# Patient Record
Sex: Female | Born: 1945 | Race: Black or African American | Hispanic: No | Smoking: Never smoker
Health system: Southern US, Community
[De-identification: ages and names within clinical notes are randomized; demographics above are authoritative.]

## PROBLEM LIST (undated history)

## (undated) DIAGNOSIS — E785 Hyperlipidemia, unspecified: Secondary | ICD-10-CM

## (undated) DIAGNOSIS — H409 Unspecified glaucoma: Secondary | ICD-10-CM

## (undated) DIAGNOSIS — I10 Essential (primary) hypertension: Secondary | ICD-10-CM

## (undated) DIAGNOSIS — E119 Type 2 diabetes mellitus without complications: Secondary | ICD-10-CM

## (undated) DIAGNOSIS — Z9989 Dependence on other enabling machines and devices: Secondary | ICD-10-CM

## (undated) DIAGNOSIS — I82409 Acute embolism and thrombosis of unspecified deep veins of unspecified lower extremity: Secondary | ICD-10-CM

## (undated) DIAGNOSIS — M109 Gout, unspecified: Secondary | ICD-10-CM

## (undated) HISTORY — PX: BREAST BIOPSY: SHX20

## (undated) HISTORY — PX: ABDOMINAL HYSTERECTOMY: SHX81

## (undated) HISTORY — DX: Hyperlipidemia, unspecified: E78.5

## (undated) HISTORY — PX: COLONOSCOPY: SHX174

## (undated) HISTORY — PX: EYE SURGERY: SHX253

## (undated) HISTORY — DX: Type 2 diabetes mellitus without complications: E11.9

---

## 2011-08-24 ENCOUNTER — Other Ambulatory Visit: Payer: Self-pay | Admitting: Internal Medicine

## 2011-08-24 DIAGNOSIS — Z1231 Encounter for screening mammogram for malignant neoplasm of breast: Secondary | ICD-10-CM

## 2011-09-21 ENCOUNTER — Ambulatory Visit
Admission: RE | Admit: 2011-09-21 | Discharge: 2011-09-21 | Disposition: A | Discharge status: Home / Self Care | Payer: Medicare Other | Source: Ambulatory Visit | Attending: Internal Medicine | Admitting: Internal Medicine

## 2011-09-21 DIAGNOSIS — Z1231 Encounter for screening mammogram for malignant neoplasm of breast: Secondary | ICD-10-CM

## 2011-11-23 ENCOUNTER — Other Ambulatory Visit: Payer: Self-pay | Admitting: Internal Medicine

## 2011-11-23 DIAGNOSIS — M810 Age-related osteoporosis without current pathological fracture: Secondary | ICD-10-CM

## 2011-11-28 ENCOUNTER — Ambulatory Visit
Admission: RE | Admit: 2011-11-28 | Discharge: 2011-11-28 | Disposition: A | Discharge status: Home / Self Care | Payer: Medicare Other | Source: Ambulatory Visit | Attending: Internal Medicine | Admitting: Internal Medicine

## 2011-11-28 DIAGNOSIS — M810 Age-related osteoporosis without current pathological fracture: Secondary | ICD-10-CM

## 2011-11-28 DIAGNOSIS — Z780 Asymptomatic menopausal state: Secondary | ICD-10-CM

## 2012-07-09 ENCOUNTER — Emergency Department (HOSPITAL_COMMUNITY)
Admission: EM | Admit: 2012-07-09 | Discharge: 2012-07-10 | Disposition: A | Discharge status: Home / Self Care | Payer: Medicare Other | Attending: Emergency Medicine | Admitting: Emergency Medicine

## 2012-07-09 ENCOUNTER — Encounter (HOSPITAL_COMMUNITY): Payer: Self-pay | Admitting: *Deleted

## 2012-07-09 DIAGNOSIS — Z79899 Other long term (current) drug therapy: Secondary | ICD-10-CM | POA: Insufficient documentation

## 2012-07-09 DIAGNOSIS — I10 Essential (primary) hypertension: Secondary | ICD-10-CM | POA: Insufficient documentation

## 2012-07-09 DIAGNOSIS — E119 Type 2 diabetes mellitus without complications: Secondary | ICD-10-CM | POA: Insufficient documentation

## 2012-07-09 HISTORY — DX: Essential (primary) hypertension: I10

## 2012-07-09 LAB — CBC
HCT: 38.4 % (ref 36.0–46.0)
Hemoglobin: 13.3 g/dL (ref 12.0–15.0)
MCV: 85.7 fL (ref 78.0–100.0)
RBC: 4.48 MIL/uL (ref 3.87–5.11)
WBC: 5.5 10*3/uL (ref 4.0–10.5)

## 2012-07-09 LAB — URINALYSIS, ROUTINE W REFLEX MICROSCOPIC
Glucose, UA: NEGATIVE mg/dL
Ketones, ur: NEGATIVE mg/dL
Leukocytes, UA: NEGATIVE
Nitrite: NEGATIVE
Specific Gravity, Urine: 1.022 (ref 1.005–1.030)
pH: 5.5 (ref 5.0–8.0)

## 2012-07-09 LAB — POCT I-STAT, CHEM 8
BUN: 14 mg/dL (ref 6–23)
Calcium, Ion: 1.23 mmol/L (ref 1.13–1.30)
Chloride: 104 mEq/L (ref 96–112)
Glucose, Bld: 101 mg/dL — ABNORMAL HIGH (ref 70–99)
TCO2: 32 mmol/L (ref 0–100)

## 2012-07-09 LAB — BASIC METABOLIC PANEL
CO2: 25 mEq/L (ref 19–32)
Chloride: 105 mEq/L (ref 96–112)
Creatinine, Ser: 0.81 mg/dL (ref 0.50–1.10)
GFR calc Af Amer: 85 mL/min — ABNORMAL LOW (ref >90)
Potassium: 3.6 mEq/L (ref 3.5–5.1)
Sodium: 142 mEq/L (ref 135–145)

## 2012-07-09 MED ORDER — ALBUTEROL SULFATE (5 MG/ML) 0.5% IN NEBU
INHALATION_SOLUTION | RESPIRATORY_TRACT | Status: AC
  Filled 2012-07-09: qty 1

## 2012-07-09 MED ORDER — IPRATROPIUM BROMIDE 0.02 % IN SOLN
RESPIRATORY_TRACT | Status: AC
  Filled 2012-07-09: qty 2.5

## 2012-07-09 NOTE — ED Notes (Signed)
PA at bedside.

## 2012-07-09 NOTE — ED Notes (Signed)
Lab contacted about wait time for CBC. Per lab sample was clotted. Phlebotomy at bedside redrawing.

## 2012-07-09 NOTE — ED Notes (Signed)
Pt reports increase in BP since 11th. Saw PCP on the 11th, increased BP Labetalol from 20 mg to 50 mg. Since then pt reports continued high BP. Pt reports this AM 191/98, which is "normal" for her since the 11th. Pt here today to "know what is going on." Denies HA, pain, complaint. AO x4.

## 2012-07-09 NOTE — ED Notes (Signed)
Pt was seen at her MD office earlier this month and states had bp medication was changed and states that bp is flucuating higher than normal.  Pt states yesterday bp was in the 190s systolic.  PT reports headache

## 2012-07-09 NOTE — ED Provider Notes (Signed)
History     CSN: [id number]  Arrival date & time 07/09/12  1810   First MD Initiated Contact with Patient 07/09/12 2152      Chief Complaint  Patient presents with  . Hypertension    (Consider location/radiation/quality/duration/timing/severity/associated sxs/prior treatment) The history is provided by the patient. No language interpreter was used.   67 year old female presents emergency department with chief complaint of high blood pressure.  The patient is a poor historian.The patient's primary care physician is Dr.Avubere.  She also has a history of hypertension and diabetes.  Patient states that she has been taking losartan and metoprolol.  But that her blood pressures have been running high.  She states she has been taking her medications as directed.  Patient became concerned today when her blood pressure was elevated above 190.  She has a book that she has been reading about blood pressures which state that she should seek medical evaluation of her blood pressure is above 180/120.  Patient is concerned because her husband died of kidney failure.  She has multiple family  members who have had kidney failure secondary to longstanding HTN. Patient denies any recent increase in her salt intake.  She denies any use of OTC medications containing ephedrine or diet pills.  Patient denies any headaches, altered mental status, seizures, nausea or vomiting.  She denies any chest pain.  Patient denies any focal neurologic deficits such as hemiparesis, amaurosis fugax, difficulty with speech or gait.  Past Medical History  Diagnosis Date  . Hypertension     Past Surgical History  Procedure Laterality Date  . Abdominal hysterectomy      No family history on file.  History  Substance Use Topics  . Smoking status: Never Smoker   . Smokeless tobacco: Not on file  . Alcohol Use: No    OB History   Grav Para Term Preterm Abortions TAB SAB Ect Mult Living                  Review of  Systems Ten systems reviewed and are negative for acute change, except as noted in the HPI.   Allergies  Review of patient's allergies indicates no known allergies.  Home Medications   Current Outpatient Rx  Name  Route  Sig  Dispense  Refill  . atorvastatin (LIPITOR) 40 MG tablet   Oral   Take 40 mg by mouth daily.         Marland Kitchen losartan (COZAAR) 50 MG tablet   Oral   Take 50 mg by mouth daily.         . metFORMIN (GLUCOPHAGE) 850 MG tablet   Oral   Take 850 mg by mouth daily.         . metoprolol succinate (TOPROL-XL) 50 MG 24 hr tablet   Oral   Take 50 mg by mouth daily. Take with or immediately following a meal.           BP 161/83  Pulse 53  Temp(Src) 97.3 F (36.3 C) (Oral)  Resp 17  SpO2 98%  Physical Exam Physical Exam  Nursing note and vitals reviewed. Constitutional: She is oriented to person, place, and time. She appears well-developed and well-nourished. No distress.  HENT:  Head: Normocephalic and atraumatic.  Eyes: Conjunctivae normal and EOM are normal. Pupils are equal, round, and reactive to light. No scleral icterus.  Neck: Normal range of motion.  Cardiovascular: Normal rate, regular rhythm and normal heart sounds.  Exam reveals no gallop  and no friction rub.   No murmur heard. Pulmonary/Chest: Effort normal and breath sounds normal. No respiratory distress.  Abdominal: Soft. Bowel sounds are normal. She exhibits no distension and no mass. There is no tenderness. There is no guarding.  Neurological: She is alert and oriented to person, place, and time.  Skin: Skin is warm and dry. She is not diaphoretic.    ED Course  Procedures (including critical care time)  Labs Reviewed  POCT I-STAT, CHEM 8 - Abnormal; Notable for the following:    Glucose, Bld 101 (*)    All other components within normal limits  CBC  URINALYSIS, ROUTINE W REFLEX MICROSCOPIC  BASIC METABOLIC PANEL   No results found.   No diagnosis found.    MDM    Filed Vitals:   07/09/12 2155 07/09/12 2200 07/09/12 2245 07/10/12 0000  BP: 177/83 161/83 164/70 155/85  Pulse: 51 53 52 51  Temp:      TempSrc:      Resp: 18 17 17    SpO2: 100% 98% 99% 100%  patient with asymptomatic HTN.  I have discussed with the patient what qualifies as an emergent situation and what we treat here in the ED>  I let the patient know that we will evaluate basic labs, and if there is no sign of end organ damage. She will need to see her PCP for management.  Patient is without si/sx of stroke.      Patients labs are without acute abnormality. I will dc the patient with instructions to see her PCP for med adjustment. i have let the patient know that her kidney fx  Is fine.  At this time there does not appear to be any evidence of an acute emergency medical condition and the patient appears stable for discharge with appropriate outpatient follow up.Diagnosis was discussed with patient who verbalizes understanding and is agreeable to discharge.       Arthor Captain, PA-C 07/12/12 2110

## 2012-07-13 NOTE — ED Provider Notes (Signed)
Medical screening examination/treatment/procedure(s) were performed by non-physician practitioner and as supervising physician I was immediately available for consultation/collaboration.   Suzi Roots, MD 07/13/12 1031

## 2012-10-31 ENCOUNTER — Other Ambulatory Visit: Payer: Self-pay

## 2012-10-31 DIAGNOSIS — Z1231 Encounter for screening mammogram for malignant neoplasm of breast: Secondary | ICD-10-CM

## 2012-11-08 ENCOUNTER — Ambulatory Visit: Payer: Medicare Other

## 2012-12-25 ENCOUNTER — Ambulatory Visit: Payer: Medicare Other

## 2013-01-03 ENCOUNTER — Ambulatory Visit
Admission: RE | Admit: 2013-01-03 | Discharge: 2013-01-03 | Disposition: A | Discharge status: Home / Self Care | Payer: Medicare Other | Source: Ambulatory Visit

## 2013-01-03 DIAGNOSIS — Z1231 Encounter for screening mammogram for malignant neoplasm of breast: Secondary | ICD-10-CM

## 2013-01-07 ENCOUNTER — Other Ambulatory Visit: Payer: Self-pay | Admitting: Internal Medicine

## 2013-01-07 DIAGNOSIS — R928 Other abnormal and inconclusive findings on diagnostic imaging of breast: Secondary | ICD-10-CM

## 2013-01-21 ENCOUNTER — Ambulatory Visit
Admission: RE | Admit: 2013-01-21 | Discharge: 2013-01-21 | Disposition: A | Discharge status: Home / Self Care | Payer: PRIVATE HEALTH INSURANCE | Source: Ambulatory Visit | Attending: Internal Medicine | Admitting: Internal Medicine

## 2013-01-21 DIAGNOSIS — R928 Other abnormal and inconclusive findings on diagnostic imaging of breast: Secondary | ICD-10-CM

## 2013-01-28 ENCOUNTER — Other Ambulatory Visit: Payer: Medicare Other

## 2013-01-28 ENCOUNTER — Inpatient Hospital Stay: Admission: RE | Admit: 2013-01-28 | Payer: Medicare Other | Source: Ambulatory Visit

## 2013-03-19 ENCOUNTER — Ambulatory Visit (INDEPENDENT_AMBULATORY_CARE_PROVIDER_SITE_OTHER): Payer: Medicare Other

## 2013-03-19 VITALS — BP 130/72 | HR 77 | Resp 12 | Ht 65.0 in | Wt 207.0 lb

## 2013-03-19 DIAGNOSIS — E1149 Type 2 diabetes mellitus with other diabetic neurological complication: Secondary | ICD-10-CM

## 2013-03-19 DIAGNOSIS — L608 Other nail disorders: Secondary | ICD-10-CM

## 2013-03-19 DIAGNOSIS — B351 Tinea unguium: Secondary | ICD-10-CM

## 2013-03-19 DIAGNOSIS — E1140 Type 2 diabetes mellitus with diabetic neuropathy, unspecified: Secondary | ICD-10-CM | POA: Insufficient documentation

## 2013-03-19 DIAGNOSIS — E1142 Type 2 diabetes mellitus with diabetic polyneuropathy: Secondary | ICD-10-CM

## 2013-03-19 NOTE — Patient Instructions (Signed)
Onychomycosis/Fungal Toenails  WHAT IS IT? An infection that lies within the keratin of your nail plate that is caused by a fungus.  WHY ME? Fungal infections affect all ages, sexes, races, and creeds.  There may be many factors that predispose you to a fungal infection such as age, coexisting medical conditions such as diabetes, or an autoimmune disease; stress, medications, fatigue, genetics, etc.  Bottom line: fungus thrives in a warm, moist environment and your shoes offer such a location.  IS IT CONTAGIOUS? Theoretically, yes.  You do not want to share shoes, nail clippers or files with someone who has fungal toenails.  Walking around barefoot in the same room or sleeping in the same bed is unlikely to transfer the organism.  It is important to realize, however, that fungus can spread easily from one nail to the next on the same foot.  HOW DO WE TREAT THIS?  There are several ways to treat this condition.  Treatment may depend on many factors such as age, medications, pregnancy, liver and kidney conditions, etc.  It is best to ask your doctor which options are available to you.  1. No treatment.   Unlike many other medical concerns, you can live with this condition.  However for many people this can be a painful condition and may lead to ingrown toenails or a bacterial infection.  It is recommended that you keep the nails cut short to help reduce the amount of fungal nail. 2. Topical treatment.  These range from herbal remedies to prescription strength nail lacquers.  About 40-50% effective, topicals require twice daily application for approximately 9 to 12 months or until an entirely new nail has grown out.  The most effective topicals are medical grade medications available through physicians offices. 3. Oral antifungal medications.  With an 80-90% cure rate, the most common oral medication requires 3 to 4 months of therapy and stays in your system for a year as the new nail grows out.  Oral  antifungal medications do require blood work to make sure it is a safe drug for you.  A liver function panel will be performed prior to starting the medication and after the first month of treatment.  It is important to have the blood work performed to avoid any harmful side effects.  In general, this medication safe but blood work is required. 4. Laser Therapy.  This treatment is performed by applying a specialized laser to the affected nail plate.  This therapy is noninvasive, fast, and non-painful.  It is not covered by insurance and is therefore, out of pocket.  The results have been very good with a 80-95% cure rate.  The [facility] is the only practice in the area to offer this therapy. 5. Permanent Nail Avulsion.  Removing the entire nail so that a new nail will not grow back. 6. Apply topical Fungi-Nail to affected nails twice daily for 12 month. Minimum

## 2013-03-19 NOTE — Progress Notes (Signed)
  Subjective:    Patient ID: Sharon Michael, female    DOB: 07/07/1945, 67 y.o.   MRN: [id number] "Trim the toenails and the fungus came back."  HPI no changes medication her health history. Nails particular second toes bilateral show thickening darkening discoloration and incurvation. Patient been applying topical Fungi-Nail since April of this year.    Review of Systems different at this visit     Objective:   Physical Exam  Pedal pulses dorsalis pedis +2/4 PT pulse thready to absent bilateral zero over four bilateral. Epicritic and proprioceptive sensations diminished on Semmes Weinstein testing to forefoot digits bilateral. No varicosities no excessive edema noted. Orthopedic exam reveals rigid digital contractures hammertoe/claw toe contractures of toes. 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 Patient denies to continue topical antifungal therapy utilizing Fungi-Nail twice daily to the affected nails. Nails debrided 1 through 5 bilateral at this time the presence of diabetes complicating  factors. Followup in 3 months for continued palliative care  Alvan Dame DP

## 2013-06-18 ENCOUNTER — Ambulatory Visit (INDEPENDENT_AMBULATORY_CARE_PROVIDER_SITE_OTHER): Payer: Medicare HMO

## 2013-06-18 VITALS — BP 103/61 | HR 49 | Resp 16

## 2013-06-18 DIAGNOSIS — E1140 Type 2 diabetes mellitus with diabetic neuropathy, unspecified: Secondary | ICD-10-CM

## 2013-06-18 DIAGNOSIS — E1142 Type 2 diabetes mellitus with diabetic polyneuropathy: Secondary | ICD-10-CM

## 2013-06-18 DIAGNOSIS — M79609 Pain in unspecified limb: Secondary | ICD-10-CM

## 2013-06-18 DIAGNOSIS — B351 Tinea unguium: Secondary | ICD-10-CM

## 2013-06-18 DIAGNOSIS — Q828 Other specified congenital malformations of skin: Secondary | ICD-10-CM

## 2013-06-18 DIAGNOSIS — E1149 Type 2 diabetes mellitus with other diabetic neurological complication: Secondary | ICD-10-CM

## 2013-06-18 NOTE — Patient Instructions (Signed)
Diabetes and Foot Care Diabetes may cause you to have problems because of poor blood supply (circulation) to your feet and legs. This may cause the skin on your feet to become thinner, break easier, and heal more slowly. Your skin may become dry, and the skin may peel and crack. You may also have nerve damage in your legs and feet causing decreased feeling in them. You may not notice minor injuries to your feet that could lead to infections or more serious problems. Taking care of your feet is one of the most important things you can do for yourself.  HOME CARE INSTRUCTIONS  Wear shoes at all times, even in the house. Do not go barefoot. Bare feet are easily injured.  Check your feet daily for blisters, cuts, and redness. If you cannot see the bottom of your feet, use a mirror or ask someone for help.  Wash your feet with warm water (do not use hot water) and mild soap. Then pat your feet and the areas between your toes until they are completely dry. Do not soak your feet as this can dry your skin.  Apply a moisturizing lotion or petroleum jelly (that does not contain alcohol and is unscented) to the skin on your feet and to dry, brittle toenails. Do not apply lotion between your toes.  Trim your toenails straight across. Do not dig under them or around the cuticle. File the edges of your nails with an emery board or nail file.  Do not cut corns or calluses or try to remove them with medicine.  Wear clean socks or stockings every day. Make sure they are not too tight. Do not wear knee-high stockings since they may decrease blood flow to your legs.  Wear shoes that fit properly and have enough cushioning. To break in new shoes, wear them for just a few hours a day. This prevents you from injuring your feet. Always look in your shoes before you put them on to be sure there are no objects inside.  Do not cross your legs. This may decrease the blood flow to your feet.  If you find a minor scrape,  cut, or break in the skin on your feet, keep it and the skin around it clean and dry. These areas may be cleansed with mild soap and water. Do not cleanse the area with peroxide, alcohol, or iodine.  When you remove an adhesive bandage, be sure not to damage the skin around it.  If you have a wound, look at it several times a day to make sure it is healing.  Do not use heating pads or hot water bottles. They may burn your skin. If you have lost feeling in your feet or legs, you may not know it is happening until it is too late.  Make sure your health care provider performs a complete foot exam at least annually or more often if you have foot problems. Report any cuts, sores, or bruises to your health care provider immediately. SEEK MEDICAL CARE IF:   You have an injury that is not healing.  You have cuts or breaks in the skin.  You have an ingrown nail.  You notice redness on your legs or feet.  You feel burning or tingling in your legs or feet.  You have pain or cramps in your legs and feet.  Your legs or feet are numb.  Your feet always feel cold. SEEK IMMEDIATE MEDICAL CARE IF:   There is increasing redness,   swelling, or pain in or around a wound.  There is a red line that goes up your leg.  Pus is coming from a wound.  You develop a fever or as directed by your health care provider.  You notice a bad smell coming from an ulcer or wound. Document Released: 04/29/2000 Document Revised: 01/02/2013 Document Reviewed: 10/09/2012 [employer] Patient Information 2014 [employer].  

## 2013-06-18 NOTE — Progress Notes (Signed)
   Subjective:    Patient ID: Sharon Michael, female    DOB: 03/09/1946, 68 y.o.   MRN: [id number]  HPI I am here to get my toenails cut and I do have some fungus and I have been doing the fungi nails    Review of Systems deferred at this visit     Objective:   Physical Exam Vascular status as follows dorsalis pedis pulse two over four bilateral PT thready to absent bilateral posse plus one over 4 on the left epicritic and proprioceptive sensations intact and symmetric although diminished bilateral on Semmes Weinstein testing to the digits and plantar forefoot. There is normal plantar response DTRs not elicited. Dermatologically skin color pigment normal no open wounds ulcerations no excessive edema noted orthopedic exam reveals rigid digital contractures 2 through 5 there is pinch callus the hallux bilateral as well as keratoses sub-fifth MTP area bilateral secondary digital contracture and plantar flexed metatarsals.       Assessment & Plan:  Assessment this time diabetes with history peripheral neuropathy and complications. Patient's thick brittle crumbly dystrophic nails 1 through 5 bilateral on debridement presence of diabetes as well as onychomycosis great advised to continue with topical Fungi-Nail over-the-counter on a daily application twice daily application as instructed. Also at this time debridement multiple keratoses sub-fifth bilateral and pinch callus first right Neosporin and Band-Aid applied to the fifth bilateral following debridement return for future palliative care and as-needed basis maintain accommodative diabetic shoes as needed reappointed 3 months next  Alvan Dameichard Jammy Stlouis DPM

## 2013-09-13 ENCOUNTER — Ambulatory Visit (INDEPENDENT_AMBULATORY_CARE_PROVIDER_SITE_OTHER): Payer: Medicare HMO

## 2013-09-13 VITALS — BP 136/79 | HR 68 | Resp 16

## 2013-09-13 DIAGNOSIS — E1140 Type 2 diabetes mellitus with diabetic neuropathy, unspecified: Secondary | ICD-10-CM

## 2013-09-13 DIAGNOSIS — Q828 Other specified congenital malformations of skin: Secondary | ICD-10-CM

## 2013-09-13 DIAGNOSIS — M79609 Pain in unspecified limb: Secondary | ICD-10-CM

## 2013-09-13 DIAGNOSIS — B351 Tinea unguium: Secondary | ICD-10-CM

## 2013-09-13 DIAGNOSIS — E1149 Type 2 diabetes mellitus with other diabetic neurological complication: Secondary | ICD-10-CM

## 2013-09-13 DIAGNOSIS — E1142 Type 2 diabetes mellitus with diabetic polyneuropathy: Secondary | ICD-10-CM

## 2013-09-13 NOTE — Patient Instructions (Signed)
Diabetes and Foot Care Diabetes may cause you to have problems because of poor blood supply (circulation) to your feet and legs. This may cause the skin on your feet to become thinner, break easier, and heal more slowly. Your skin may become dry, and the skin may peel and crack. You may also have nerve damage in your legs and feet causing decreased feeling in them. You may not notice minor injuries to your feet that could lead to infections or more serious problems. Taking care of your feet is one of the most important things you can do for yourself.  HOME CARE INSTRUCTIONS  Wear shoes at all times, even in the house. Do not go barefoot. Bare feet are easily injured.  Check your feet daily for blisters, cuts, and redness. If you cannot see the bottom of your feet, use a mirror or ask someone for help.  Wash your feet with warm water (do not use hot water) and mild soap. Then pat your feet and the areas between your toes until they are completely dry. Do not soak your feet as this can dry your skin.  Apply a moisturizing lotion or petroleum jelly (that does not contain alcohol and is unscented) to the skin on your feet and to dry, brittle toenails. Do not apply lotion between your toes.  Trim your toenails straight across. Do not dig under them or around the cuticle. File the edges of your nails with an emery board or nail file.  Do not cut corns or calluses or try to remove them with medicine.  Wear clean socks or stockings every day. Make sure they are not too tight. Do not wear knee-high stockings since they may decrease blood flow to your legs.  Wear shoes that fit properly and have enough cushioning. To break in new shoes, wear them for just a few hours a day. This prevents you from injuring your feet. Always look in your shoes before you put them on to be sure there are no objects inside.  Do not cross your legs. This may decrease the blood flow to your feet.  If you find a minor scrape,  cut, or break in the skin on your feet, keep it and the skin around it clean and dry. These areas may be cleansed with mild soap and water. Do not cleanse the area with peroxide, alcohol, or iodine.  When you remove an adhesive bandage, be sure not to damage the skin around it.  If you have a wound, look at it several times a day to make sure it is healing.  Do not use heating pads or hot water bottles. They may burn your skin. If you have lost feeling in your feet or legs, you may not know it is happening until it is too late.  Make sure your health care provider performs a complete foot exam at least annually or more often if you have foot problems. Report any cuts, sores, or bruises to your health care provider immediately. SEEK MEDICAL CARE IF:   You have an injury that is not healing.  You have cuts or breaks in the skin.  You have an ingrown nail.  You notice redness on your legs or feet.  You feel burning or tingling in your legs or feet.  You have pain or cramps in your legs and feet.  Your legs or feet are numb.  Your feet always feel cold. SEEK IMMEDIATE MEDICAL CARE IF:   There is increasing redness,   swelling, or pain in or around a wound.  There is a red line that goes up your leg.  Pus is coming from a wound.  You develop a fever or as directed by your health care provider.  You notice a bad smell coming from an ulcer or wound. Document Released: 04/29/2000 Document Revised: 01/02/2013 Document Reviewed: 10/09/2012 [employer] Patient Information 2014 [employer].  

## 2013-09-13 NOTE — Progress Notes (Signed)
   Subjective:    Patient ID: Sharon PurlAmmie Michael, female    DOB: 01/18/1946, 68 y.o.   MRN: [id number]  HPI Comments: "I need my toenails cut"       Review of Systems And any systemic changes or findings    Objective:   Physical Exam Neurovascular status is intact DP +2/4 bilateral PT thready one over 4 bilateral there is For refill timed 3-4 seconds all digits. Epicritic and proprioceptive sensations intact although diminished bilateral on Semmes Weinstein testing to the digits and plantar forefoot. Patient does have some hyperesthesia in her toes on debridement and on palpation. There is a single keratotic lesion sub-fifth MTP area left painful direct lateral compression consistent with porokeratosis nails thick brittle crumbly friable discolored and darkened consistent with onychomycosis second right being most painful and symptomatic attempted debridement and open wounds ulcerations no secondary infection is noted orthopedic exam reveals mild flexible digital contractures       Assessment & Plan:  Assessment this time his diabetes with history peripheral neuropathy plan at this time thick brittle crumbly friable criptotic nails painful symptomatic 1 through 5 bilateral debridement at this time still keratotic lesion sub-5 left is debrided entry with lumicain Neosporin and Band-Aid dressing. Maintain dressings return in 3 months for continued followup and palliative care in the future as needed maintain appropriate coming shoes at all times  Alvan Dameichard Kylyn Mcdade DPM

## 2013-12-06 ENCOUNTER — Other Ambulatory Visit: Payer: Self-pay | Admitting: Internal Medicine

## 2013-12-06 DIAGNOSIS — E2839 Other primary ovarian failure: Secondary | ICD-10-CM

## 2013-12-13 ENCOUNTER — Ambulatory Visit
Admission: RE | Admit: 2013-12-13 | Discharge: 2013-12-13 | Disposition: A | Discharge status: Home / Self Care | Payer: Commercial Managed Care - HMO | Source: Ambulatory Visit | Attending: Internal Medicine | Admitting: Internal Medicine

## 2013-12-13 DIAGNOSIS — E2839 Other primary ovarian failure: Secondary | ICD-10-CM

## 2013-12-20 ENCOUNTER — Ambulatory Visit (INDEPENDENT_AMBULATORY_CARE_PROVIDER_SITE_OTHER): Payer: Medicare HMO | Admitting: Podiatrist

## 2013-12-20 DIAGNOSIS — B351 Tinea unguium: Secondary | ICD-10-CM

## 2013-12-20 DIAGNOSIS — M79609 Pain in unspecified limb: Secondary | ICD-10-CM

## 2013-12-20 DIAGNOSIS — E1140 Type 2 diabetes mellitus with diabetic neuropathy, unspecified: Secondary | ICD-10-CM

## 2013-12-20 DIAGNOSIS — E1149 Type 2 diabetes mellitus with other diabetic neurological complication: Secondary | ICD-10-CM

## 2013-12-20 DIAGNOSIS — E1142 Type 2 diabetes mellitus with diabetic polyneuropathy: Secondary | ICD-10-CM

## 2013-12-20 DIAGNOSIS — M79673 Pain in unspecified foot: Secondary | ICD-10-CM

## 2013-12-20 NOTE — Progress Notes (Signed)
HPI: Patient presents today for follow up of diabetic foot and nail care. Past medical history, meds, and allergies reviewed. Patient states blood sugar is under good  control.   Objective:   Objective:  Patients chart is reviewed.  Vascular status reveals pedal pulses noted at  2 out of 4 dp and pt bilateral .  Neurological sensation is Normal to [facility] bilateral at 2/5 sites bilateral.  Dermatological exam reveals  presence of very mild pre ulcerative/ hyperkeratotic lesions submetatarsal 5 bilateral.   Toenails are elongated, incurvated, discolored, dystrophic with ingrown deformity present. Flexible digital contractures are also noted   Assessment: Diabetes with Neuropathy , Ingrown nail deformity, hyperkeratotic lesion x 2   Plan: Discussed treatment options and alternatives. Debrided nails without complication. Debrided hyperkeratotic lesions without complication.  Return appointment recommended at routine intervals of 3 months.   Marlowe AschoffKathryn Callyn Severtson, DPM

## 2013-12-20 NOTE — Patient Instructions (Signed)
Diabetes and Foot Care Diabetes may cause you to have problems because of poor blood supply (circulation) to your feet and legs. This may cause the skin on your feet to become thinner, break easier, and heal more slowly. Your skin may become dry, and the skin may peel and crack. You may also have nerve damage in your legs and feet causing decreased feeling in them. You may not notice minor injuries to your feet that could lead to infections or more serious problems. Taking care of your feet is one of the most important things you can do for yourself.  HOME CARE INSTRUCTIONS  Wear shoes at all times, even in the house. Do not go barefoot. Bare feet are easily injured.  Check your feet daily for blisters, cuts, and redness. If you cannot see the bottom of your feet, use a mirror or ask someone for help.  Wash your feet with warm water (do not use hot water) and mild soap. Then pat your feet and the areas between your toes until they are completely dry. Do not soak your feet as this can dry your skin.  Apply a moisturizing lotion or petroleum jelly (that does not contain alcohol and is unscented) to the skin on your feet and to dry, brittle toenails. Do not apply lotion between your toes.  Trim your toenails straight across. Do not dig under them or around the cuticle. File the edges of your nails with an emery board or nail file.  Do not cut corns or calluses or try to remove them with medicine.  Wear clean socks or stockings every day. Make sure they are not too tight. Do not wear knee-high stockings since they may decrease blood flow to your legs.  Wear shoes that fit properly and have enough cushioning. To break in new shoes, wear them for just a few hours a day. This prevents you from injuring your feet. Always look in your shoes before you put them on to be sure there are no objects inside.  Do not cross your legs. This may decrease the blood flow to your feet.  If you find a minor scrape,  cut, or break in the skin on your feet, keep it and the skin around it clean and dry. These areas may be cleansed with mild soap and water. Do not cleanse the area with peroxide, alcohol, or iodine.  When you remove an adhesive bandage, be sure not to damage the skin around it.  If you have a wound, look at it several times a day to make sure it is healing.  Do not use heating pads or hot water bottles. They may burn your skin. If you have lost feeling in your feet or legs, you may not know it is happening until it is too late.  Make sure your health care provider performs a complete foot exam at least annually or more often if you have foot problems. Report any cuts, sores, or bruises to your health care provider immediately. SEEK MEDICAL CARE IF:   You have an injury that is not healing.  You have cuts or breaks in the skin.  You have an ingrown nail.  You notice redness on your legs or feet.  You feel burning or tingling in your legs or feet.  You have pain or cramps in your legs and feet.  Your legs or feet are numb.  Your feet always feel cold. SEEK IMMEDIATE MEDICAL CARE IF:   There is increasing redness,   swelling, or pain in or around a wound.  There is a red line that goes up your leg.  Pus is coming from a wound.  You develop a fever or as directed by your health care provider.  You notice a bad smell coming from an ulcer or wound. Document Released: 04/29/2000 Document Revised: 01/02/2013 Document Reviewed: 10/09/2012 [employer] Patient Information 2015 [employer]. This information is not intended to replace advice given to you by your health care provider. Make sure you discuss any questions you have with your health care provider.  

## 2013-12-23 ENCOUNTER — Telehealth: Payer: Self-pay | Admitting: *Deleted

## 2013-12-23 ENCOUNTER — Encounter: Payer: Self-pay | Admitting: *Deleted

## 2013-12-23 NOTE — Telephone Encounter (Signed)
I called and informed her she can come by to pick up her letter.  She asked what time we closed.  I told her 5pm.  She said she would be by before 5pm.

## 2013-12-23 NOTE — Telephone Encounter (Signed)
I need a doctor's slip letting them know about my feet.  They want us to wear boots and I'm not able to wear boots.  I wear sneakers.  I need a note saying I have to wear sneakers.  She said as long as I get a slip it should be okay.

## 2013-12-23 NOTE — Telephone Encounter (Signed)
I called you once, just spoke to someone.  We had a meeting on Saturday.  They want us to wear boots.  I need a note saying I don't have to wear the boots.  I need a written thing from my doctor's office.

## 2014-01-29 ENCOUNTER — Other Ambulatory Visit: Payer: Self-pay

## 2014-01-29 DIAGNOSIS — Z1231 Encounter for screening mammogram for malignant neoplasm of breast: Secondary | ICD-10-CM

## 2014-02-12 ENCOUNTER — Ambulatory Visit
Admission: RE | Admit: 2014-02-12 | Discharge: 2014-02-12 | Disposition: A | Discharge status: Home / Self Care | Payer: Medicare HMO | Source: Ambulatory Visit

## 2014-02-12 DIAGNOSIS — Z1231 Encounter for screening mammogram for malignant neoplasm of breast: Secondary | ICD-10-CM

## 2014-03-25 ENCOUNTER — Ambulatory Visit: Payer: Medicare HMO

## 2014-04-01 ENCOUNTER — Ambulatory Visit (INDEPENDENT_AMBULATORY_CARE_PROVIDER_SITE_OTHER): Payer: Medicare HMO

## 2014-04-01 DIAGNOSIS — B351 Tinea unguium: Secondary | ICD-10-CM

## 2014-04-01 DIAGNOSIS — Q828 Other specified congenital malformations of skin: Secondary | ICD-10-CM

## 2014-04-01 DIAGNOSIS — M79673 Pain in unspecified foot: Secondary | ICD-10-CM

## 2014-04-01 DIAGNOSIS — E1140 Type 2 diabetes mellitus with diabetic neuropathy, unspecified: Secondary | ICD-10-CM

## 2014-04-01 NOTE — Progress Notes (Signed)
   Subjective:    Patient ID: Sharon Michael, female    DOB: 07/08/1945, 68 y.o.   MRN: [id number]  HPI  Pt presents for nail debridement  Review of Systemsno new findings or systemic changes noted     Objective:   Physical Exam Vascular status is intact DP +2 PT 1 over 4 bilateral thready Refill time 4 seconds all digits sensations diminished on Semmes Weinstein to the forefoot digits and arch. There is normal plantar response. There is keratotic lesion sub-5 bilateral left more so than right which on debridement were treated with lumicain Neosporin at this time. Patient also has multiple thick brittle dystrophic friable mycotic nails 1 through 5 bilateral painful tender both on palpation and with enclosed shoe wear. No open wounds no ulcers no secondary infection is noted. Rectus foot type with mild digital contractures are identified       Assessment & Plan:  Assessment this time is diabetes history peripheral neuropathy painful mycotic nails debrided 10 also multiple keratoses sub-5 bilateral debrided at this time Neosporin and Band-Aid dressings applied to both feet fifth MTP areas. Reappointed 3 months for palliative care  Alvan Dameichard Lakeitha Basques DPM

## 2014-07-01 ENCOUNTER — Ambulatory Visit (INDEPENDENT_AMBULATORY_CARE_PROVIDER_SITE_OTHER): Payer: Medicare HMO

## 2014-07-01 DIAGNOSIS — Q828 Other specified congenital malformations of skin: Secondary | ICD-10-CM

## 2014-07-01 DIAGNOSIS — E1140 Type 2 diabetes mellitus with diabetic neuropathy, unspecified: Secondary | ICD-10-CM

## 2014-07-01 DIAGNOSIS — B351 Tinea unguium: Secondary | ICD-10-CM

## 2014-07-01 DIAGNOSIS — M79673 Pain in unspecified foot: Secondary | ICD-10-CM

## 2014-07-01 NOTE — Progress Notes (Signed)
   Subjective:    Patient ID: Sharon Michael, female    DOB: 04/07/1946, 69 y.o.   MRN: [id number]  HPI  Pt presents for nail debridement  Review of Systems no new findings or systemic changes noted     Objective:   Physical Exam Neurovascular status unchanged DP +2 PT 1 over 4 bilateral there is keratoses sub-fifth MTP area bilateral there is thick brittle crumbly dystrophic friable nails 1 through 5 bilateral painful tender both on palpation and debridement at this time. No open wounds no ulcers no secondary infections mild digital contractures are noted       Assessment & Plan:  Assessment this time is diabetes history peripheral neuropathy painful mycotic nails debrided 1 through 5 bilateral at this time also debridement keratoses sub-5 bilateral sub-fifth right history with lumicain and Neosporin and a Band-Aid follow-up in 3 months for continued palliative care  Alvan Dameichard Syed Zukas DPM

## 2014-10-07 ENCOUNTER — Ambulatory Visit: Payer: Medicare HMO

## 2014-10-08 ENCOUNTER — Encounter: Payer: Self-pay | Admitting: Podiatry

## 2014-10-08 ENCOUNTER — Ambulatory Visit (INDEPENDENT_AMBULATORY_CARE_PROVIDER_SITE_OTHER): Payer: Medicare HMO

## 2014-10-08 ENCOUNTER — Ambulatory Visit (INDEPENDENT_AMBULATORY_CARE_PROVIDER_SITE_OTHER): Payer: Medicare HMO | Admitting: Podiatry

## 2014-10-08 DIAGNOSIS — M79674 Pain in right toe(s): Secondary | ICD-10-CM

## 2014-10-08 DIAGNOSIS — B351 Tinea unguium: Secondary | ICD-10-CM | POA: Diagnosis not present

## 2014-10-08 DIAGNOSIS — Q828 Other specified congenital malformations of skin: Secondary | ICD-10-CM | POA: Diagnosis not present

## 2014-10-08 DIAGNOSIS — M79673 Pain in unspecified foot: Secondary | ICD-10-CM | POA: Diagnosis not present

## 2014-10-08 DIAGNOSIS — E1140 Type 2 diabetes mellitus with diabetic neuropathy, unspecified: Secondary | ICD-10-CM

## 2014-10-08 DIAGNOSIS — M779 Enthesopathy, unspecified: Secondary | ICD-10-CM

## 2014-10-08 MED ORDER — TRIAMCINOLONE ACETONIDE 10 MG/ML IJ SUSP
10.0000 mg | Freq: Once | INTRAMUSCULAR | Status: AC
  Administered 2014-10-08: 10 mg

## 2014-10-10 NOTE — Progress Notes (Signed)
Subjective:     Patient ID: Sharon Michael, female   DOB: 04/16/1946, 69 y.o.   MRN: [id number]  HPI patient states I'm getting a lot of pain around my big toe joint right I have calluses and corns that form on both feet that are painful and my nailbeds become sore on both feet and I've had long-term diabetes   Review of Systems  All other systems reviewed and are negative.      Objective:   Physical Exam  Constitutional: She is oriented to person, place, and time.  Cardiovascular: Intact distal pulses.   Musculoskeletal: Normal range of motion.  Neurological: She is oriented to person, place, and time.  Skin: Skin is warm and dry.  Nursing note and vitals reviewed.  neurovascular status found to be mildly diminished with diminishment of sharp Dole vibratory and diminished PT and DP pulses noted. Patient has keratotic lesion sub-fifth metatarsal of both feet and has inflammation pain first MPJ right and is noted also to have elongated thickened nailbeds 1-5 both feet that are painful bilateral     Assessment:     Lesion secondary to pressure along with mycotic painful nail infections bilateral and inflammation and pain in the first MPJ right with mild reduction of motion of the joint and a long-term at risk diabetic    Plan:     H&P and all conditions reviewed with patient. Injected around the first MPJ right 3 mg Kenalog 5 mg Xylocaine and debrided lesions on both feet and debrided nailbeds 1-5 both feet with no iatrogenic bleeding noted

## 2014-10-14 ENCOUNTER — Telehealth: Payer: Self-pay | Admitting: *Deleted

## 2014-10-14 MED ORDER — TRAMADOL HCL 50 MG PO TABS: 50.0000 mg | ORAL_TABLET | Freq: Three times a day (TID) | ORAL | Status: DC | PRN

## 2014-10-14 NOTE — Telephone Encounter (Addendum)
Pt states she received and injection at the last visit, now has swelling and aching to her feet and would like a pain medication for the pain and swelling.  Dr Charlsie Merlesegal ordered Tramadol 50mg  #30 one every 8 hours and I informed pt that she would need to hand carry the rx to the pharmacy.  Pt states she will pick it up after work tomorrow morning.

## 2014-10-14 NOTE — Telephone Encounter (Signed)
Tramadol if she is not allergic

## 2015-01-09 ENCOUNTER — Other Ambulatory Visit: Payer: Self-pay

## 2015-01-09 DIAGNOSIS — Z1231 Encounter for screening mammogram for malignant neoplasm of breast: Secondary | ICD-10-CM

## 2015-01-15 ENCOUNTER — Ambulatory Visit: Payer: Medicare HMO | Admitting: Podiatry

## 2015-01-28 ENCOUNTER — Ambulatory Visit (INDEPENDENT_AMBULATORY_CARE_PROVIDER_SITE_OTHER): Payer: Medicare HMO | Admitting: Podiatry

## 2015-01-28 DIAGNOSIS — B351 Tinea unguium: Secondary | ICD-10-CM | POA: Diagnosis not present

## 2015-01-28 DIAGNOSIS — M79676 Pain in unspecified toe(s): Secondary | ICD-10-CM | POA: Diagnosis not present

## 2015-01-28 NOTE — Patient Instructions (Signed)
Diabetes and Foot Care Diabetes may cause you to have problems because of poor blood supply (circulation) to your feet and legs. This may cause the skin on your feet to become thinner, break easier, and heal more slowly. Your skin may become dry, and the skin may peel and crack. You may also have nerve damage in your legs and feet causing decreased feeling in them. You may not notice minor injuries to your feet that could lead to infections or more serious problems. Taking care of your feet is one of the most important things you can do for yourself.  HOME CARE INSTRUCTIONS  Wear shoes at all times, even in the house. Do not go barefoot. Bare feet are easily injured.  Check your feet daily for blisters, cuts, and redness. If you cannot see the bottom of your feet, use a mirror or ask someone for help.  Wash your feet with warm water (do not use hot water) and mild soap. Then pat your feet and the areas between your toes until they are completely dry. Do not soak your feet as this can dry your skin.  Apply a moisturizing lotion or petroleum jelly (that does not contain alcohol and is unscented) to the skin on your feet and to dry, brittle toenails. Do not apply lotion between your toes.  Trim your toenails straight across. Do not dig under them or around the cuticle. File the edges of your nails with an emery board or nail file.  Do not cut corns or calluses or try to remove them with medicine.  Wear clean socks or stockings every day. Make sure they are not too tight. Do not wear knee-high stockings since they may decrease blood flow to your legs.  Wear shoes that fit properly and have enough cushioning. To break in new shoes, wear them for just a few hours a day. This prevents you from injuring your feet. Always look in your shoes before you put them on to be sure there are no objects inside.  Do not cross your legs. This may decrease the blood flow to your feet.  If you find a minor scrape,  cut, or break in the skin on your feet, keep it and the skin around it clean and dry. These areas may be cleansed with mild soap and water. Do not cleanse the area with peroxide, alcohol, or iodine.  When you remove an adhesive bandage, be sure not to damage the skin around it.  If you have a wound, look at it several times a day to make sure it is healing.  Do not use heating pads or hot water bottles. They may burn your skin. If you have lost feeling in your feet or legs, you may not know it is happening until it is too late.  Make sure your health care provider performs a complete foot exam at least annually or more often if you have foot problems. Report any cuts, sores, or bruises to your health care provider immediately. SEEK MEDICAL CARE IF:   You have an injury that is not healing.  You have cuts or breaks in the skin.  You have an ingrown nail.  You notice redness on your legs or feet.  You feel burning or tingling in your legs or feet.  You have pain or cramps in your legs and feet.  Your legs or feet are numb.  Your feet always feel cold. SEEK IMMEDIATE MEDICAL CARE IF:   There is increasing redness,   swelling, or pain in or around a wound.  There is a red line that goes up your leg.  Pus is coming from a wound.  You develop a fever or as directed by your health care provider.  You notice a bad smell coming from an ulcer or wound. Document Released: 04/29/2000 Document Revised: 01/02/2013 Document Reviewed: 10/09/2012 [employer] Patient Information 2015 [employer]. This information is not intended to replace advice given to you by your health care provider. Make sure you discuss any questions you have with your health care provider.  

## 2015-01-28 NOTE — Progress Notes (Signed)
   Subjective:    Patient ID: Sharon Michael, female    DOB: 1946-04-23, 69 y.o.   MRN: [id number]  HPI   Patient presents today complaining of painful toenails and requestinShe denies any history of claudication or amputationg nail debridement        Review of Systems  All other systems reviewed and are negative.      Objective:   Physical Exam  Orientated 3  Vascular: DPs 2/4 bilaterally PT is 1/4 bilaterally  Neurological: Sensation to 10 g monofilament wire intact 4/5 right 2/5 left Ankle reflexes reactive bilaterally  Dermatological: No open wounds noted bilaterally No acute keratoses plantar left The toenails are brittle elongated, hypertrophic 6-10        Assessment & Plan:   Assessment: Mildly decreased pedal pulses History of peripheral neuropathy Symptomatic onychomycoses 6-10 Keratoses 1  Plan: Debridement toenails 10 mechanically and left without a bleeding Debrided plantar callus 1 without a bleeding  Reappoint 3 months

## 2015-02-16 ENCOUNTER — Ambulatory Visit
Admission: RE | Admit: 2015-02-16 | Discharge: 2015-02-16 | Disposition: A | Discharge status: Home / Self Care | Payer: Medicare HMO | Source: Ambulatory Visit

## 2015-02-16 DIAGNOSIS — Z1231 Encounter for screening mammogram for malignant neoplasm of breast: Secondary | ICD-10-CM

## 2015-05-06 ENCOUNTER — Ambulatory Visit (INDEPENDENT_AMBULATORY_CARE_PROVIDER_SITE_OTHER): Payer: Medicare HMO | Admitting: Podiatry

## 2015-05-06 DIAGNOSIS — M79676 Pain in unspecified toe(s): Secondary | ICD-10-CM | POA: Diagnosis not present

## 2015-05-06 DIAGNOSIS — B351 Tinea unguium: Secondary | ICD-10-CM | POA: Diagnosis not present

## 2015-05-06 NOTE — Patient Instructions (Signed)
Diabetes and Foot Care Diabetes may cause you to have problems because of poor blood supply (circulation) to your feet and legs. This may cause the skin on your feet to become thinner, break easier, and heal more slowly. Your skin may become dry, and the skin may peel and crack. You may also have nerve damage in your legs and feet causing decreased feeling in them. You may not notice minor injuries to your feet that could lead to infections or more serious problems. Taking care of your feet is one of the most important things you can do for yourself.  HOME CARE INSTRUCTIONS  Wear shoes at all times, even in the house. Do not go barefoot. Bare feet are easily injured.  Check your feet daily for blisters, cuts, and redness. If you cannot see the bottom of your feet, use a mirror or ask someone for help.  Wash your feet with warm water (do not use hot water) and mild soap. Then pat your feet and the areas between your toes until they are completely dry. Do not soak your feet as this can dry your skin.  Apply a moisturizing lotion or petroleum jelly (that does not contain alcohol and is unscented) to the skin on your feet and to dry, brittle toenails. Do not apply lotion between your toes.  Trim your toenails straight across. Do not dig under them or around the cuticle. File the edges of your nails with an emery board or nail file.  Do not cut corns or calluses or try to remove them with medicine.  Wear clean socks or stockings every day. Make sure they are not too tight. Do not wear knee-high stockings since they may decrease blood flow to your legs.  Wear shoes that fit properly and have enough cushioning. To break in new shoes, wear them for just a few hours a day. This prevents you from injuring your feet. Always look in your shoes before you put them on to be sure there are no objects inside.  Do not cross your legs. This may decrease the blood flow to your feet.  If you find a minor scrape,  cut, or break in the skin on your feet, keep it and the skin around it clean and dry. These areas may be cleansed with mild soap and water. Do not cleanse the area with peroxide, alcohol, or iodine.  When you remove an adhesive bandage, be sure not to damage the skin around it.  If you have a wound, look at it several times a day to make sure it is healing.  Do not use heating pads or hot water bottles. They may burn your skin. If you have lost feeling in your feet or legs, you may not know it is happening until it is too late.  Make sure your health care provider performs a complete foot exam at least annually or more often if you have foot problems. Report any cuts, sores, or bruises to your health care provider immediately. SEEK MEDICAL CARE IF:   You have an injury that is not healing.  You have cuts or breaks in the skin.  You have an ingrown nail.  You notice redness on your legs or feet.  You feel burning or tingling in your legs or feet.  You have pain or cramps in your legs and feet.  Your legs or feet are numb.  Your feet always feel cold. SEEK IMMEDIATE MEDICAL CARE IF:   There is increasing redness,   swelling, or pain in or around a wound.  There is a red line that goes up your leg.  Pus is coming from a wound.  You develop a fever or as directed by your health care provider.  You notice a bad smell coming from an ulcer or wound.   This information is not intended to replace advice given to you by your health care provider. Make sure you discuss any questions you have with your health care provider.   Document Released: 04/29/2000 Document Revised: 01/02/2013 Document Reviewed: 10/09/2012 Elsevier Interactive Patient Education 2016 [employer]  

## 2015-05-07 NOTE — Progress Notes (Signed)
Patient ID: Sharon Michael, female   DOB: 08/06/1945, 69 y.o.   MRN: [id number]  Subjective: This patient presents today complaining of elongated and thickened toenails which are uncomfortable when she walks and wearing shoes and requests nail debridement  Objective: No open skin lesions bilaterally The toenails are hypertrophic, discolored, deformed and tender to direct palpation 6-10  Assessment: Symptomatic onychomycoses 6-10 Diabetic with a history of neuropathy  Plan: Debridement toenails 6-10 mechanically and electrically without a bleeding  Reappoint 3 months

## 2015-07-07 ENCOUNTER — Other Ambulatory Visit: Payer: Self-pay | Admitting: Internal Medicine

## 2015-07-07 DIAGNOSIS — E2839 Other primary ovarian failure: Secondary | ICD-10-CM

## 2015-08-05 ENCOUNTER — Ambulatory Visit (INDEPENDENT_AMBULATORY_CARE_PROVIDER_SITE_OTHER): Payer: Medicare HMO | Admitting: Podiatry

## 2015-08-05 ENCOUNTER — Encounter: Payer: Self-pay | Admitting: Podiatry

## 2015-08-05 DIAGNOSIS — B351 Tinea unguium: Secondary | ICD-10-CM | POA: Diagnosis not present

## 2015-08-05 DIAGNOSIS — Q828 Other specified congenital malformations of skin: Secondary | ICD-10-CM

## 2015-08-05 DIAGNOSIS — M79676 Pain in unspecified toe(s): Secondary | ICD-10-CM

## 2015-08-05 DIAGNOSIS — E1140 Type 2 diabetes mellitus with diabetic neuropathy, unspecified: Secondary | ICD-10-CM

## 2015-08-05 NOTE — Patient Instructions (Signed)
Diabetes and Foot Care Diabetes may cause you to have problems because of poor blood supply (circulation) to your feet and legs. This may cause the skin on your feet to become thinner, break easier, and heal more slowly. Your skin may become dry, and the skin may peel and crack. You may also have nerve damage in your legs and feet causing decreased feeling in them. You may not notice minor injuries to your feet that could lead to infections or more serious problems. Taking care of your feet is one of the most important things you can do for yourself.  HOME CARE INSTRUCTIONS  Wear shoes at all times, even in the house. Do not go barefoot. Bare feet are easily injured.  Check your feet daily for blisters, cuts, and redness. If you cannot see the bottom of your feet, use a mirror or ask someone for help.  Wash your feet with warm water (do not use hot water) and mild soap. Then pat your feet and the areas between your toes until they are completely dry. Do not soak your feet as this can dry your skin.  Apply a moisturizing lotion or petroleum jelly (that does not contain alcohol and is unscented) to the skin on your feet and to dry, brittle toenails. Do not apply lotion between your toes.  Trim your toenails straight across. Do not dig under them or around the cuticle. File the edges of your nails with an emery board or nail file.  Do not cut corns or calluses or try to remove them with medicine.  Wear clean socks or stockings every day. Make sure they are not too tight. Do not wear knee-high stockings since they may decrease blood flow to your legs.  Wear shoes that fit properly and have enough cushioning. To break in new shoes, wear them for just a few hours a day. This prevents you from injuring your feet. Always look in your shoes before you put them on to be sure there are no objects inside.  Do not cross your legs. This may decrease the blood flow to your feet.  If you find a minor scrape,  cut, or break in the skin on your feet, keep it and the skin around it clean and dry. These areas may be cleansed with mild soap and water. Do not cleanse the area with peroxide, alcohol, or iodine.  When you remove an adhesive bandage, be sure not to damage the skin around it.  If you have a wound, look at it several times a day to make sure it is healing.  Do not use heating pads or hot water bottles. They may burn your skin. If you have lost feeling in your feet or legs, you may not know it is happening until it is too late.  Make sure your health care provider performs a complete foot exam at least annually or more often if you have foot problems. Report any cuts, sores, or bruises to your health care provider immediately. SEEK MEDICAL CARE IF:   You have an injury that is not healing.  You have cuts or breaks in the skin.  You have an ingrown nail.  You notice redness on your legs or feet.  You feel burning or tingling in your legs or feet.  You have pain or cramps in your legs and feet.  Your legs or feet are numb.  Your feet always feel cold. SEEK IMMEDIATE MEDICAL CARE IF:   There is increasing redness,   swelling, or pain in or around a wound.  There is a red line that goes up your leg.  Pus is coming from a wound.  You develop a fever or as directed by your health care provider.  You notice a bad smell coming from an ulcer or wound.   This information is not intended to replace advice given to you by your health care provider. Make sure you discuss any questions you have with your health care provider.   Document Released: 04/29/2000 Document Revised: 01/02/2013 Document Reviewed: 10/09/2012 Elsevier Interactive Patient Education 2016 [employer]  

## 2015-08-05 NOTE — Progress Notes (Signed)
Patient ID: Sharon Michael, female   DOB: 02/26/1946, 70 y.o.   MRN: [id number]   Subjective: This patient presents today complaining of elongated and thickened toenails which are uncomfortable when she walks and wearing shoes and requests nail debridement. 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

## 2015-11-11 ENCOUNTER — Ambulatory Visit (INDEPENDENT_AMBULATORY_CARE_PROVIDER_SITE_OTHER): Payer: Medicare HMO | Admitting: Podiatry

## 2015-11-11 ENCOUNTER — Encounter: Payer: Self-pay | Admitting: Podiatry

## 2015-11-11 DIAGNOSIS — E1140 Type 2 diabetes mellitus with diabetic neuropathy, unspecified: Secondary | ICD-10-CM

## 2015-11-11 DIAGNOSIS — M79676 Pain in unspecified toe(s): Secondary | ICD-10-CM | POA: Diagnosis not present

## 2015-11-11 DIAGNOSIS — B351 Tinea unguium: Secondary | ICD-10-CM | POA: Diagnosis not present

## 2015-11-11 DIAGNOSIS — Q828 Other specified congenital malformations of skin: Secondary | ICD-10-CM | POA: Diagnosis not present

## 2015-11-11 NOTE — Progress Notes (Signed)
Patient ID: Sharon Michael, female   DOB: 05/06/1946, 70 y.o.   MRN: [id number]  Subjective: This patient presents today complaining of elongated and thickened toenails which are uncomfortable when she walks and wearing shoes and requests nail debridement. 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

## 2015-11-11 NOTE — Patient Instructions (Signed)
Diabetes and Foot Care Diabetes may cause you to have problems because of poor blood supply (circulation) to your feet and legs. This may cause the skin on your feet to become thinner, break easier, and heal more slowly. Your skin may become dry, and the skin may peel and crack. You may also have nerve damage in your legs and feet causing decreased feeling in them. You may not notice minor injuries to your feet that could lead to infections or more serious problems. Taking care of your feet is one of the most important things you can do for yourself.  HOME CARE INSTRUCTIONS  Wear shoes at all times, even in the house. Do not go barefoot. Bare feet are easily injured.  Check your feet daily for blisters, cuts, and redness. If you cannot see the bottom of your feet, use a mirror or ask someone for help.  Wash your feet with warm water (do not use hot water) and mild soap. Then pat your feet and the areas between your toes until they are completely dry. Do not soak your feet as this can dry your skin.  Apply a moisturizing lotion or petroleum jelly (that does not contain alcohol and is unscented) to the skin on your feet and to dry, brittle toenails. Do not apply lotion between your toes.  Trim your toenails straight across. Do not dig under them or around the cuticle. File the edges of your nails with an emery board or nail file.  Do not cut corns or calluses or try to remove them with medicine.  Wear clean socks or stockings every day. Make sure they are not too tight. Do not wear knee-high stockings since they may decrease blood flow to your legs.  Wear shoes that fit properly and have enough cushioning. To break in new shoes, wear them for just a few hours a day. This prevents you from injuring your feet. Always look in your shoes before you put them on to be sure there are no objects inside.  Do not cross your legs. This may decrease the blood flow to your feet.  If you find a minor scrape,  cut, or break in the skin on your feet, keep it and the skin around it clean and dry. These areas may be cleansed with mild soap and water. Do not cleanse the area with peroxide, alcohol, or iodine.  When you remove an adhesive bandage, be sure not to damage the skin around it.  If you have a wound, look at it several times a day to make sure it is healing.  Do not use heating pads or hot water bottles. They may burn your skin. If you have lost feeling in your feet or legs, you may not know it is happening until it is too late.  Make sure your health care provider performs a complete foot exam at least annually or more often if you have foot problems. Report any cuts, sores, or bruises to your health care provider immediately. SEEK MEDICAL CARE IF:   You have an injury that is not healing.  You have cuts or breaks in the skin.  You have an ingrown nail.  You notice redness on your legs or feet.  You feel burning or tingling in your legs or feet.  You have pain or cramps in your legs and feet.  Your legs or feet are numb.  Your feet always feel cold. SEEK IMMEDIATE MEDICAL CARE IF:   There is increasing redness,   swelling, or pain in or around a wound.  There is a red line that goes up your leg.  Pus is coming from a wound.  You develop a fever or as directed by your health care provider.  You notice a bad smell coming from an ulcer or wound.   This information is not intended to replace advice given to you by your health care provider. Make sure you discuss any questions you have with your health care provider.   Document Released: 04/29/2000 Document Revised: 01/02/2013 Document Reviewed: 10/09/2012 Elsevier Interactive Patient Education 2016 [employer]  

## 2015-11-24 ENCOUNTER — Telehealth: Payer: Self-pay | Admitting: *Deleted

## 2015-11-24 NOTE — Telephone Encounter (Addendum)
Pt states she has a swollen foot and big toe and would like to see a doctor, who would give her an injection, if her primary doctor who she was seeing this morning could not help her.  I was unable to leave a message on pt's contact number, the message was to call again. Pt called again states her primary doctor took care of the problem.

## 2015-12-21 ENCOUNTER — Other Ambulatory Visit: Payer: Medicare HMO

## 2016-01-11 ENCOUNTER — Other Ambulatory Visit: Payer: Self-pay | Admitting: Internal Medicine

## 2016-01-11 DIAGNOSIS — Z1231 Encounter for screening mammogram for malignant neoplasm of breast: Secondary | ICD-10-CM

## 2016-01-26 ENCOUNTER — Encounter (INDEPENDENT_AMBULATORY_CARE_PROVIDER_SITE_OTHER): Payer: Medicare HMO | Admitting: Podiatry

## 2016-01-26 NOTE — Progress Notes (Signed)
This encounter was created in error - please disregard.

## 2016-02-17 ENCOUNTER — Ambulatory Visit (INDEPENDENT_AMBULATORY_CARE_PROVIDER_SITE_OTHER): Payer: Medicare HMO | Admitting: Podiatry

## 2016-02-17 ENCOUNTER — Encounter: Payer: Self-pay | Admitting: Podiatry

## 2016-02-17 VITALS — BP 182/104 | HR 63 | Resp 18

## 2016-02-17 DIAGNOSIS — Q828 Other specified congenital malformations of skin: Secondary | ICD-10-CM

## 2016-02-17 DIAGNOSIS — B351 Tinea unguium: Secondary | ICD-10-CM

## 2016-02-17 DIAGNOSIS — M79676 Pain in unspecified toe(s): Secondary | ICD-10-CM | POA: Diagnosis not present

## 2016-02-17 NOTE — Patient Instructions (Signed)
Diabetes and Foot Care Diabetes may cause you to have problems because of poor blood supply (circulation) to your feet and legs. This may cause the skin on your feet to become thinner, break easier, and heal more slowly. Your skin may become dry, and the skin may peel and crack. You may also have nerve damage in your legs and feet causing decreased feeling in them. You may not notice minor injuries to your feet that could lead to infections or more serious problems. Taking care of your feet is one of the most important things you can do for yourself.  HOME CARE INSTRUCTIONS  Wear shoes at all times, even in the house. Do not go barefoot. Bare feet are easily injured.  Check your feet daily for blisters, cuts, and redness. If you cannot see the bottom of your feet, use a mirror or ask someone for help.  Wash your feet with warm water (do not use hot water) and mild soap. Then pat your feet and the areas between your toes until they are completely dry. Do not soak your feet as this can dry your skin.  Apply a moisturizing lotion or petroleum jelly (that does not contain alcohol and is unscented) to the skin on your feet and to dry, brittle toenails. Do not apply lotion between your toes.  Trim your toenails straight across. Do not dig under them or around the cuticle. File the edges of your nails with an emery board or nail file.  Do not cut corns or calluses or try to remove them with medicine.  Wear clean socks or stockings every day. Make sure they are not too tight. Do not wear knee-high stockings since they may decrease blood flow to your legs.  Wear shoes that fit properly and have enough cushioning. To break in new shoes, wear them for just a few hours a day. This prevents you from injuring your feet. Always look in your shoes before you put them on to be sure there are no objects inside.  Do not cross your legs. This may decrease the blood flow to your feet.  If you find a minor scrape,  cut, or break in the skin on your feet, keep it and the skin around it clean and dry. These areas may be cleansed with mild soap and water. Do not cleanse the area with peroxide, alcohol, or iodine.  When you remove an adhesive bandage, be sure not to damage the skin around it.  If you have a wound, look at it several times a day to make sure it is healing.  Do not use heating pads or hot water bottles. They may burn your skin. If you have lost feeling in your feet or legs, you may not know it is happening until it is too late.  Make sure your health care provider performs a complete foot exam at least annually or more often if you have foot problems. Report any cuts, sores, or bruises to your health care provider immediately. SEEK MEDICAL CARE IF:   You have an injury that is not healing.  You have cuts or breaks in the skin.  You have an ingrown nail.  You notice redness on your legs or feet.  You feel burning or tingling in your legs or feet.  You have pain or cramps in your legs and feet.  Your legs or feet are numb.  Your feet always feel cold. SEEK IMMEDIATE MEDICAL CARE IF:   There is increasing redness,   swelling, or pain in or around a wound.  There is a red line that goes up your leg.  Pus is coming from a wound.  You develop a fever or as directed by your health care provider.  You notice a bad smell coming from an ulcer or wound.   This information is not intended to replace advice given to you by your health care provider. Make sure you discuss any questions you have with your health care provider.   Document Released: 04/29/2000 Document Revised: 01/02/2013 Document Reviewed: 10/09/2012 Elsevier Interactive Patient Education 2016 [employer]  

## 2016-02-18 NOTE — Progress Notes (Signed)
Patient ID: Sharon Michael, female   DOB: 05/21/1945, 70 y.o.   MRN: [id number]   Subjective: This patient presents today complaining of elongated and thickened toenails which are uncomfortable when she walks and wearing shoes and requests nail debridement. 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

## 2016-02-22 ENCOUNTER — Ambulatory Visit
Admission: RE | Admit: 2016-02-22 | Discharge: 2016-02-22 | Disposition: A | Discharge status: Home / Self Care | Payer: Medicare HMO | Source: Ambulatory Visit | Attending: Internal Medicine | Admitting: Internal Medicine

## 2016-02-22 DIAGNOSIS — Z1231 Encounter for screening mammogram for malignant neoplasm of breast: Secondary | ICD-10-CM

## 2016-05-18 ENCOUNTER — Encounter: Payer: Self-pay | Admitting: Podiatry

## 2016-05-18 ENCOUNTER — Ambulatory Visit (INDEPENDENT_AMBULATORY_CARE_PROVIDER_SITE_OTHER): Payer: Medicare HMO | Admitting: Podiatry

## 2016-05-18 DIAGNOSIS — Q828 Other specified congenital malformations of skin: Secondary | ICD-10-CM | POA: Diagnosis not present

## 2016-05-18 DIAGNOSIS — E1140 Type 2 diabetes mellitus with diabetic neuropathy, unspecified: Secondary | ICD-10-CM

## 2016-05-18 DIAGNOSIS — B351 Tinea unguium: Secondary | ICD-10-CM

## 2016-05-18 DIAGNOSIS — M79676 Pain in unspecified toe(s): Secondary | ICD-10-CM | POA: Diagnosis not present

## 2016-05-18 NOTE — Patient Instructions (Signed)

## 2016-05-19 NOTE — Progress Notes (Signed)
Patient ID: Sharon Michael, female   DOB: 05/23/1945, 71 y.o.   MRN: [id number]    Subjective: This patient presents today complaining of elongated and thickened toenails which are uncomfortable when she walks and wearing shoes and requests nail debridement. She is also complaining of a painful nucleated keratoses on the plantar aspect of the left foot  Objective: No open skin lesions bilaterally DP and PT pulses 2/4 bilaterally Capillary reflex immediate bilaterally Sensation to 10 g monofilament wire intact 5/5 bilaterally Vibratory sensation intact bilaterally Ankle reflex equal and reactive bilaterally Hammertoe second-third bilaterally The toenails are hypertrophic, discolored, deformed and tender to direct palpation 6-10 Nucleated keratoses plantar left fifth MPJ Manual motor testing dorsi flexion, plantar flexion 5/5 bilaterally  Assessment: Symptomatic onychomycoses 6-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

## 2016-06-29 ENCOUNTER — Encounter (INDEPENDENT_AMBULATORY_CARE_PROVIDER_SITE_OTHER): Payer: Self-pay | Admitting: Orthopaedic Surgery

## 2016-06-29 ENCOUNTER — Ambulatory Visit (INDEPENDENT_AMBULATORY_CARE_PROVIDER_SITE_OTHER): Payer: Medicare HMO | Admitting: Orthopaedic Surgery

## 2016-06-29 DIAGNOSIS — G8929 Other chronic pain: Secondary | ICD-10-CM | POA: Diagnosis not present

## 2016-06-29 DIAGNOSIS — M25562 Pain in left knee: Secondary | ICD-10-CM

## 2016-06-29 MED ORDER — LIDOCAINE HCL 1 % IJ SOLN
3.0000 mL | INTRAMUSCULAR | Status: AC | PRN
  Administered 2016-06-29: 3 mL

## 2016-06-29 MED ORDER — METHYLPREDNISOLONE ACETATE 40 MG/ML IJ SUSP
40.0000 mg | INTRAMUSCULAR | Status: AC | PRN
  Administered 2016-06-29: 40 mg via INTRA_ARTICULAR

## 2016-06-29 NOTE — Progress Notes (Signed)
   Office Visit Note   Patient: Sharon Michael           Date of Birth: 12/24/1945           MRN: [id number] Visit Date: 06/29/2016              Requested by: Fleet ContrasEdwin Avbuere, MD [address] [place], [place] [id number] PCP: Dorrene GermanEdwin A Avbuere, MD   Assessment & Plan: Visit Diagnoses:  1. Chronic pain of left knee     Plan: She tolerated the steroid injection in her knee. I agree with her having this sensitive been so long since her last injection. She knows to weight was 3-4 months between injections. She'll still work on weight loss and activity modification as well as quad strengthening exercises.  Follow-Up Instructions: Return if symptoms worsen or fail to improve.   Orders:  No orders of the defined types were placed in this encounter.  No orders of the defined types were placed in this encounter.     Procedures: Large Joint Inj Date/Time: 06/29/2016 2:00 PM Performed by: Kathryne HitchBLACKMAN, Sharlynn Seckinger Y Authorized by: Kathryne HitchBLACKMAN, Taylan Marez Y   Location:  Knee Site:  L knee Ultrasound Guidance: No   Fluoroscopic Guidance: No   Arthrogram: No   Medications:  3 mL lidocaine 1 %; 40 mg methylPREDNISolone acetate 40 MG/ML     Clinical Data: No additional findings.   Subjective: Chief Complaint  Patient presents with  . Left Knee - Pain   HPI The patient is a very pleasant 71 year old we've seen over the years in our practice for left knee pain. She's had injections in the past that helped greatly. She has not had one quite some time and comes in today actually requesting another steroid injection in her Review of Systems Denies any recent illnesses. She denies any fever chills, nausea, vomiting.  Objective: Vital Signs: There were no vitals taken for this visit.  Physical Exam  She is alert and oriented 3 in no acute distress Ortho Exam Examination of her left knee shows no effusion with excellent range of motion but just some global tenderness. There is little bit  of patellofemoral crepitation. Specialty Comments:  No specialty comments available.  Imaging: No results found.   PMFS History: Patient Active Problem List   Diagnosis Date Noted  . 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 Diabetes mellitus without complication (HCC)   . Hyperlipidemia   . Hypertension     Family History  Problem Relation Age of Onset  . Kidney disease Mother   . Hypertension Mother   . Diabetes Mother   . Heart disease Father   . Hypertension Father   . Diabetes Father   . Kidney disease Sister   . Cancer Brother     Past Surgical History:  Procedure Laterality Date  . ABDOMINAL HYSTERECTOMY     Social History   Occupational History  . Not on file.   Social History Main Topics  . Smoking status: Never Smoker  . Smokeless tobacco: Never Used  . Alcohol use No  . Drug use: No  . Sexual activity: Not on file

## 2016-07-19 ENCOUNTER — Other Ambulatory Visit: Payer: Self-pay | Admitting: Internal Medicine

## 2016-07-19 DIAGNOSIS — E2839 Other primary ovarian failure: Secondary | ICD-10-CM

## 2016-07-25 ENCOUNTER — Ambulatory Visit
Admission: RE | Admit: 2016-07-25 | Discharge: 2016-07-25 | Disposition: A | Discharge status: Home / Self Care | Payer: Medicare HMO | Source: Ambulatory Visit | Attending: Internal Medicine | Admitting: Internal Medicine

## 2016-07-25 DIAGNOSIS — E2839 Other primary ovarian failure: Secondary | ICD-10-CM

## 2016-08-17 ENCOUNTER — Ambulatory Visit: Payer: Medicare HMO | Admitting: Podiatry

## 2016-08-24 ENCOUNTER — Ambulatory Visit (INDEPENDENT_AMBULATORY_CARE_PROVIDER_SITE_OTHER): Payer: Medicare HMO | Admitting: Podiatry

## 2016-08-24 DIAGNOSIS — E1140 Type 2 diabetes mellitus with diabetic neuropathy, unspecified: Secondary | ICD-10-CM

## 2016-08-24 DIAGNOSIS — M79676 Pain in unspecified toe(s): Secondary | ICD-10-CM

## 2016-08-24 DIAGNOSIS — B351 Tinea unguium: Secondary | ICD-10-CM | POA: Diagnosis not present

## 2016-08-24 NOTE — Progress Notes (Signed)
Complaint:  Visit Type: Patient returns to my office for continued preventative foot care services. Complaint: Patient states" my nails have grown long and thick and become painful to walk and wear shoes" Patient has been diagnosed with DM with no foot complications. The patient presents for preventative foot care services. No changes to ROS  Podiatric Exam: Vascular: dorsalis pedis and posterior tibial pulses are palpable bilateral. Capillary return is immediate. Temperature gradient is WNL. Skin turgor WNL  Sensorium: Normal Semmes Weinstein monofilament test. Normal tactile sensation bilaterally. Nail Exam: Pt has thick disfigured discolored nails with subungual debris noted bilateral entire nail hallux through fifth toenails Ulcer Exam: There is no evidence of ulcer or pre-ulcerative changes or infection. Orthopedic Exam: Muscle tone and strength are WNL. No limitations in general ROM. No crepitus or effusions noted. Foot type and digits show no abnormalities. Bony prominences are unremarkable. Skin:  Porokeratosis sub 5th left foot. No infection or ulcers  Diagnosis:  Onychomycosis, , Pain in right toe, pain in left toes  Treatment & Plan Procedures and Treatment: Consent by patient was obtained for treatment procedures. The patient understood the discussion of treatment and procedures well. All questions were answered thoroughly reviewed. Debridement of mycotic and hypertrophic toenails, 1 through 5 bilateral and clearing of subungual debris. No ulceration, no infection noted. Debride porokeratosis left Return Visit-Office Procedure: Patient instructed to return to the office for a follow up visit 3 months for continued evaluation and treatment.    Helane Gunther DPM

## 2016-11-30 ENCOUNTER — Encounter: Payer: Self-pay | Admitting: Podiatry

## 2016-11-30 ENCOUNTER — Ambulatory Visit (INDEPENDENT_AMBULATORY_CARE_PROVIDER_SITE_OTHER): Payer: Medicare HMO | Admitting: Podiatry

## 2016-11-30 DIAGNOSIS — B351 Tinea unguium: Secondary | ICD-10-CM

## 2016-11-30 DIAGNOSIS — M79676 Pain in unspecified toe(s): Secondary | ICD-10-CM

## 2016-11-30 DIAGNOSIS — E1140 Type 2 diabetes mellitus with diabetic neuropathy, unspecified: Secondary | ICD-10-CM | POA: Diagnosis not present

## 2016-11-30 NOTE — Progress Notes (Signed)
Complaint:  Visit Type: Patient returns to my office for continued preventative foot care services. Complaint: Patient states" my nails have grown long and thick and become painful to walk and wear shoes" Patient has been diagnosed with DM with no foot complications. The patient presents for preventative foot care services. No changes to ROS  Podiatric Exam: Vascular: dorsalis pedis and posterior tibial pulses are palpable bilateral. Capillary return is immediate. Temperature gradient is WNL. Skin turgor WNL  Sensorium: Normal Semmes Weinstein monofilament test. Normal tactile sensation bilaterally. Nail Exam: Pt has thick disfigured discolored nails with subungual debris noted bilateral entire nail hallux through fifth toenails Ulcer Exam: There is no evidence of ulcer or pre-ulcerative changes or infection. Orthopedic Exam: Muscle tone and strength are WNL. No limitations in general ROM. No crepitus or effusions noted. Foot type and digits show no abnormalities. Bony prominences are unremarkable. Skin:  Porokeratosis sub 5th left foot has resolved.. No infection or ulcers  Diagnosis:  Onychomycosis, , Pain in right toe, pain in left toes  Treatment & Plan Procedures and Treatment: Consent by patient was obtained for treatment procedures. The patient understood the discussion of treatment and procedures well. All questions were answered thoroughly reviewed. Debridement of mycotic and hypertrophic toenails, 1 through 5 bilateral and clearing of subungual debris. No ulceration, no infection noted.  Return Visit-Office Procedure: Patient instructed to return to the office for a follow up visit 3 months for continued evaluation and treatment.    Helane GuntherGregory Amazin Pincock DPM

## 2017-01-23 ENCOUNTER — Other Ambulatory Visit: Payer: Self-pay | Admitting: Internal Medicine

## 2017-01-23 DIAGNOSIS — Z1231 Encounter for screening mammogram for malignant neoplasm of breast: Secondary | ICD-10-CM

## 2017-02-08 ENCOUNTER — Ambulatory Visit (INDEPENDENT_AMBULATORY_CARE_PROVIDER_SITE_OTHER): Payer: Medicare HMO | Admitting: Orthopaedic Surgery

## 2017-02-08 DIAGNOSIS — G8929 Other chronic pain: Secondary | ICD-10-CM

## 2017-02-08 DIAGNOSIS — M25562 Pain in left knee: Principal | ICD-10-CM

## 2017-02-08 DIAGNOSIS — M1712 Unilateral primary osteoarthritis, left knee: Secondary | ICD-10-CM | POA: Diagnosis not present

## 2017-02-08 MED ORDER — LIDOCAINE HCL 1 % IJ SOLN
3.0000 mL | INTRAMUSCULAR | Status: AC | PRN
  Administered 2017-02-08: 3 mL

## 2017-02-08 MED ORDER — METHYLPREDNISOLONE ACETATE 40 MG/ML IJ SUSP
40.0000 mg | INTRAMUSCULAR | Status: AC | PRN
  Administered 2017-02-08: 40 mg via INTRA_ARTICULAR

## 2017-02-08 NOTE — Progress Notes (Signed)
   Procedure Note  Patient: Sharon Michael             Date of Birth: 1945-11-24           MRN: [id number]             Visit Date: 02/08/2017  Procedures: Visit Diagnoses: Chronic pain of left knee  Unilateral primary osteoarthritis, left knee  Large Joint Inj Date/Time: 02/08/2017 6:30 PM Performed by: Kathryne Hitch Authorized by: Kathryne Hitch   Location:  Knee Site:  L knee Ultrasound Guidance: No   Fluoroscopic Guidance: No   Arthrogram: No   Medications:  3 mL lidocaine 1 %; 40 mg methylPREDNISolone acetate 40 MG/ML   The patient is well-known to me. She has debilitating left knee pain and known arthritis in her left knee. She comes in from time to time for steroid injection in that knee. Her last injection was over 7 months ago and that did help. She is requesting another steroid injection today. She would like this in her left knee. She understands the risk and benefits of injections. Her pain is still daily and is detrimentally affected activities daily living, her quality of life, and her mobility. She has known arthritis in the knee. She still not interested in any type of knee replacement. She tolerated the steroid injection well without difficulties. All questions concerns were answered and addressed. Examination of the knee does show varus malalignment as well as patellofemoral crepitation and joint line tenderness consistent with known osteoarthritis of her knee.

## 2017-02-27 ENCOUNTER — Ambulatory Visit
Admission: RE | Admit: 2017-02-27 | Discharge: 2017-02-27 | Disposition: A | Discharge status: Home / Self Care | Payer: Medicare HMO | Source: Ambulatory Visit | Attending: Internal Medicine | Admitting: Internal Medicine

## 2017-02-27 DIAGNOSIS — Z1231 Encounter for screening mammogram for malignant neoplasm of breast: Secondary | ICD-10-CM

## 2017-03-01 ENCOUNTER — Ambulatory Visit (INDEPENDENT_AMBULATORY_CARE_PROVIDER_SITE_OTHER): Payer: Medicare HMO | Admitting: Podiatry

## 2017-03-01 ENCOUNTER — Encounter: Payer: Self-pay | Admitting: Podiatry

## 2017-03-01 DIAGNOSIS — B351 Tinea unguium: Secondary | ICD-10-CM | POA: Diagnosis not present

## 2017-03-01 DIAGNOSIS — E1140 Type 2 diabetes mellitus with diabetic neuropathy, unspecified: Secondary | ICD-10-CM

## 2017-03-01 NOTE — Progress Notes (Signed)
Complaint:  Visit Type: Patient returns to my office for continued preventative foot care services. Complaint: Patient states" my nails have grown long and thick and become painful to walk and wear shoes" Patient has been diagnosed with DM with no foot complications. The patient presents for preventative foot care services. No changes to ROS  Podiatric Exam: Vascular: dorsalis pedis and posterior tibial pulses are palpable bilateral. Capillary return is immediate. Temperature gradient is WNL. Skin turgor WNL  Sensorium: Normal Semmes Weinstein monofilament test. Normal tactile sensation bilaterally. Nail Exam: Pt has thick disfigured discolored nails with subungual debris noted bilateral entire nail hallux through fifth toenails Ulcer Exam: There is no evidence of ulcer or pre-ulcerative changes or infection. Orthopedic Exam: Muscle tone and strength are WNL. No limitations in general ROM. No crepitus or effusions noted. Foot type and digits show no abnormalities. Bony prominences are unremarkable. Skin:  Porokeratosis sub 5th left foot has resolved.. No infection or ulcers  Diagnosis:  Onychomycosis, , Pain in right toe, pain in left toes  Treatment & Plan Procedures and Treatment: Consent by patient was obtained for treatment procedures. The patient understood the discussion of treatment and procedures well. All questions were answered thoroughly reviewed. Debridement of mycotic and hypertrophic toenails, 1 through 5 bilateral and clearing of subungual debris. No ulceration, no infection noted.  Return Visit-Office Procedure: Patient instructed to return to the office for a follow up visit 10 months  for continued evaluation and treatment.    Helane GuntherGregory Joeangel Jeanpaul DPM

## 2017-05-10 ENCOUNTER — Ambulatory Visit: Payer: Medicare HMO | Admitting: Podiatry

## 2017-05-10 ENCOUNTER — Encounter: Payer: Self-pay | Admitting: Podiatry

## 2017-05-10 DIAGNOSIS — B351 Tinea unguium: Secondary | ICD-10-CM | POA: Diagnosis not present

## 2017-05-10 DIAGNOSIS — Q828 Other specified congenital malformations of skin: Secondary | ICD-10-CM

## 2017-05-10 DIAGNOSIS — E1140 Type 2 diabetes mellitus with diabetic neuropathy, unspecified: Secondary | ICD-10-CM

## 2017-05-10 NOTE — Progress Notes (Signed)
Complaint:  Visit Type: Patient returns to my office for continued preventative foot care services. Complaint: Patient states" my nails have grown long and thick and become painful to walk and wear shoes" Patient has been diagnosed with DM with no foot complications. The patient presents for preventative foot care services. No changes to ROS  Podiatric Exam: Vascular: dorsalis pedis and posterior tibial pulses are palpable bilateral. Capillary return is immediate. Temperature gradient is WNL. Skin turgor WNL  Sensorium: Normal Semmes Weinstein monofilament test. Normal tactile sensation bilaterally. Nail Exam: Pt has thick disfigured discolored nails with subungual debris noted bilateral entire nail hallux through fifth toenails Ulcer Exam: There is no evidence of ulcer or pre-ulcerative changes or infection. Orthopedic Exam: Muscle tone and strength are WNL. No limitations in general ROM. No crepitus or effusions noted. Foot type and digits show no abnormalities. Bony prominences are unremarkable. Skin:  Porokeratosis sub 5th left foot .Marland Kitchen. No infection or ulcers  Diagnosis:  Onychomycosis, , Pain in right toe, pain in left toes.  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 The patient understood the discussion of treatment and procedures well. All questions were answered thoroughly reviewed. Debridement of mycotic and hypertrophic toenails, 1 through 5 bilateral and clearing of subungual debris. No ulceration, no infection noted.  Debride p-orokeratosis Return Visit-Office Procedure: Patient instructed to return to the office for a follow up visit 10 months  for continued evaluation and treatment.    Helane GuntherGregory Ziya Coonrod DPM

## 2017-07-25 ENCOUNTER — Other Ambulatory Visit: Payer: Self-pay | Admitting: Internal Medicine

## 2017-07-25 DIAGNOSIS — Z139 Encounter for screening, unspecified: Secondary | ICD-10-CM

## 2017-08-09 ENCOUNTER — Ambulatory Visit: Payer: Medicare HMO | Admitting: Podiatry

## 2017-08-09 ENCOUNTER — Encounter: Payer: Self-pay | Admitting: Podiatry

## 2017-08-09 DIAGNOSIS — E1140 Type 2 diabetes mellitus with diabetic neuropathy, unspecified: Secondary | ICD-10-CM | POA: Diagnosis not present

## 2017-08-09 DIAGNOSIS — Q828 Other specified congenital malformations of skin: Secondary | ICD-10-CM

## 2017-08-09 DIAGNOSIS — B351 Tinea unguium: Secondary | ICD-10-CM

## 2017-08-09 NOTE — Progress Notes (Signed)
Complaint:  Visit Type: Patient returns to my office for continued preventative foot care services. Complaint: Patient states" my nails have grown long and thick and become painful to walk and wear shoes" Patient has been diagnosed with DM with no foot complications. The patient presents for preventative foot care services. No changes to ROS  Podiatric Exam: Vascular: dorsalis pedis and posterior tibial pulses are palpable bilateral. Capillary return is immediate. Temperature gradient is WNL. Skin turgor WNL  Sensorium: Normal Semmes Weinstein monofilament test. Normal tactile sensation bilaterally. Nail Exam: Pt has thick disfigured discolored nails with subungual debris noted bilateral entire nail hallux through fifth toenails Ulcer Exam: There is no evidence of ulcer or pre-ulcerative changes or infection. Orthopedic Exam: Muscle tone and strength are WNL. No limitations in general ROM. No crepitus or effusions noted. Foot type and digits show no abnormalities. Bony prominences are unremarkable. Skin:  Porokeratosis sub 5th left foot .Marland Kitchen. No infection or ulcers  Diagnosis:  Onychomycosis, , Pain in right toe, pain in left toes.  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 The patient understood the discussion of treatment and procedures well. All questions were answered thoroughly reviewed. Debridement of mycotic and hypertrophic toenails, 1 through 5 bilateral and clearing of subungual debris. No ulceration, no infection noted.  Debride porokeratosis.  Discussed possible nail removal second toenail right foot.  Return Visit-Office Procedure: Patient instructed to return to the office for a follow up visit 10 months  for continued evaluation and treatment.    Helane GuntherGregory Elizar Alpern DPM

## 2017-10-25 ENCOUNTER — Ambulatory Visit: Payer: Medicare HMO | Admitting: Podiatry

## 2017-10-25 ENCOUNTER — Encounter: Payer: Self-pay | Admitting: Podiatry

## 2017-10-25 DIAGNOSIS — B351 Tinea unguium: Secondary | ICD-10-CM | POA: Diagnosis not present

## 2017-10-25 DIAGNOSIS — Q828 Other specified congenital malformations of skin: Secondary | ICD-10-CM

## 2017-10-25 DIAGNOSIS — E1140 Type 2 diabetes mellitus with diabetic neuropathy, unspecified: Secondary | ICD-10-CM | POA: Diagnosis not present

## 2017-10-25 NOTE — Progress Notes (Signed)
Complaint:  Visit Type: Patient returns to my office for continued preventative foot care services. Complaint: Patient states" my nails have grown long and thick and become painful to walk and wear shoes" Patient has been diagnosed with DM with no foot complications. The patient presents for preventative foot care services. No changes to ROS  Podiatric Exam: Vascular: dorsalis pedis and posterior tibial pulses are palpable bilateral. Capillary return is immediate. Temperature gradient is WNL. Skin turgor WNL  Sensorium: Normal Semmes Weinstein monofilament test. Normal tactile sensation bilaterally. Nail Exam: Pt has thick disfigured discolored nails with subungual debris noted bilateral entire nail hallux through fifth toenails Ulcer Exam: There is no evidence of ulcer or pre-ulcerative changes or infection. Orthopedic Exam: Muscle tone and strength are WNL. No limitations in general ROM. No crepitus or effusions noted. Foot type and digits show no abnormalities. Bony prominences are unremarkable. Skin:  Porokeratosis sub 5th left foot .Marland Kitchen. No infection or ulcers  Diagnosis:  Onychomycosis, , Pain in right toe, pain in left toes.  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 The patient understood the discussion of treatment and procedures well. All questions were answered thoroughly reviewed. Debridement of mycotic and hypertrophic toenails, 1 through 5 bilateral and clearing of subungual debris. No ulceration, no infection noted.  Debride porokeratosis.  Discussed possible nail removal second toenail right foot.  Return Visit-Office Procedure: Patient instructed to return to the office for a follow up visit 10 months  for continued evaluation and treatment.    Helane GuntherGregory Zakary Kimura DPM

## 2017-11-09 ENCOUNTER — Ambulatory Visit (INDEPENDENT_AMBULATORY_CARE_PROVIDER_SITE_OTHER): Payer: Medicare HMO

## 2017-11-09 ENCOUNTER — Ambulatory Visit (INDEPENDENT_AMBULATORY_CARE_PROVIDER_SITE_OTHER): Payer: Medicare HMO | Admitting: Orthopaedic Surgery

## 2017-11-09 ENCOUNTER — Encounter (INDEPENDENT_AMBULATORY_CARE_PROVIDER_SITE_OTHER): Payer: Self-pay | Admitting: Orthopaedic Surgery

## 2017-11-09 DIAGNOSIS — M25561 Pain in right knee: Secondary | ICD-10-CM

## 2017-11-09 DIAGNOSIS — M1711 Unilateral primary osteoarthritis, right knee: Secondary | ICD-10-CM

## 2017-11-09 MED ORDER — LIDOCAINE HCL 1 % IJ SOLN
3.0000 mL | INTRAMUSCULAR | Status: AC | PRN
  Administered 2017-11-09: 3 mL

## 2017-11-09 MED ORDER — METHYLPREDNISOLONE ACETATE 40 MG/ML IJ SUSP
40.0000 mg | INTRAMUSCULAR | Status: AC | PRN
  Administered 2017-11-09: 40 mg via INTRA_ARTICULAR

## 2017-11-09 NOTE — Progress Notes (Signed)
Office Visit Note   Patient: Sharon Michael           Date of Birth: 08/08/1945           MRN: [id number] Visit Date: 11/09/2017              Requested by: Fleet ContrasAvbuere, Edwin, MD [address] [place], [place] [id number] PCP: Fleet ContrasAvbuere, Edwin, MD   Assessment & Plan: Visit Diagnoses:  1. Acute pain of right knee   2. Unilateral primary osteoarthritis, right knee     Plan: I agree with trying steroid injection in her right knee today since she is done so well this for the left knee.  We went over x-rays and explained in detail what is going on with her knee for osteoarthritis standpoint.  She tolerated steroid injection well.  All questions concerns were answered and addressed.  Follow-up will be as needed.  Follow-Up Instructions: Return if symptoms worsen or fail to improve.   Orders:  Orders Placed This Encounter  Procedures  . Large Joint Inj  . XR Knee 1-2 Views Right   No orders of the defined types were placed in this encounter.     Procedures: Large Joint Inj: R knee on 11/09/2017 2:48 PM Indications: diagnostic evaluation and pain Details: 22 G 1.5 in needle, superolateral approach  Arthrogram: No  Medications: 3 mL lidocaine 1 %; 40 mg methylPREDNISolone acetate 40 MG/ML Outcome: tolerated well, no immediate complications Procedure, treatment alternatives, risks and benefits explained, specific risks discussed. Consent was given by the patient. Immediately prior to procedure a time out was called to verify the correct patient, procedure, equipment, support staff and site/side marked as required. Patient was prepped and draped in the usual sterile fashion.       Clinical Data: No additional findings.   Subjective: Chief Complaint  Patient presents with  . Right Knee - Pain  Patient comes in today with acute right knee pain.  She has known osteoarthritis in her knees and been doing well until she felt a pop in her knee about a week and a half ago and had some  swelling after that.  Says hurts in the back of her knee.  She is a very active 72 year old female works several different jobs.  [facility] [employer]  We did provide a steroid injection in her left knee 9 months ago and she said the knee feels fine.  HPI  Review of Systems She currently denies any headache, chest pain, shortness of breath, fever, chills, nausea, vomiting.  She is a diabetic and reports good control.  Objective: Vital Signs: There were no vitals taken for this visit.  Physical Exam She is alert and oriented x3 and in no acute distress Ortho Exam Examination of her right knee she has pain only in the posterior aspect of her knee with good range of motion.  Her calf is soft.  She has significant patellofemoral crepitation. Specialty Comments:  No specialty comments available.  Imaging: Xr Knee 1-2 Views Right  Result Date: 11/09/2017 2 views of the right knee show severe tricompartment arthritic changes.  There are large particular osteophytes around the patella and the patellofemoral joint as well as the medial lateral compartment of the knee.  There are no acute findings otherwise.    PMFS History: Patient Active Problem List   Diagnosis Date Noted  . Chronic pain of left knee 02/08/2017  . Unilateral primary osteoarthritis, left knee 02/08/2017  . Type 2 diabetes, controlled, with  neuropathy (HCC) 03/19/2013   Past Medical History:  Diagnosis Date  . Diabetes mellitus without complication (HCC)   . Hyperlipidemia   . Hypertension     Family History  Problem Relation Age of Onset  . Kidney disease Mother   . Hypertension Mother   . Diabetes Mother   . Heart disease Father   . Hypertension Father   . Diabetes Father   . Kidney disease Sister   . Cancer Brother     Past Surgical History:  Procedure Laterality Date  . ABDOMINAL HYSTERECTOMY     Social History   Occupational History  . Not on file  Tobacco Use  . Smoking status: Never Smoker    . Smokeless tobacco: Never Used  Substance and Sexual Activity  . Alcohol use: No  . Drug use: No  . Sexual activity: Not on file

## 2018-01-01 ENCOUNTER — Emergency Department (HOSPITAL_COMMUNITY): Payer: Medicare HMO

## 2018-01-01 ENCOUNTER — Other Ambulatory Visit: Payer: Self-pay

## 2018-01-01 ENCOUNTER — Emergency Department (HOSPITAL_COMMUNITY)
Admission: EM | Admit: 2018-01-01 | Discharge: 2018-01-01 | Disposition: A | Discharge status: Home / Self Care | Payer: Medicare HMO | Attending: Emergency Medicine | Admitting: Emergency Medicine

## 2018-01-01 ENCOUNTER — Encounter (HOSPITAL_COMMUNITY): Payer: Self-pay | Admitting: Emergency Medicine

## 2018-01-01 DIAGNOSIS — L299 Pruritus, unspecified: Secondary | ICD-10-CM | POA: Diagnosis not present

## 2018-01-01 DIAGNOSIS — E119 Type 2 diabetes mellitus without complications: Secondary | ICD-10-CM | POA: Insufficient documentation

## 2018-01-01 DIAGNOSIS — Z79899 Other long term (current) drug therapy: Secondary | ICD-10-CM | POA: Insufficient documentation

## 2018-01-01 DIAGNOSIS — I10 Essential (primary) hypertension: Secondary | ICD-10-CM | POA: Insufficient documentation

## 2018-01-01 DIAGNOSIS — Z7984 Long term (current) use of oral hypoglycemic drugs: Secondary | ICD-10-CM | POA: Diagnosis not present

## 2018-01-01 DIAGNOSIS — M25562 Pain in left knee: Secondary | ICD-10-CM | POA: Insufficient documentation

## 2018-01-01 DIAGNOSIS — G8929 Other chronic pain: Secondary | ICD-10-CM | POA: Diagnosis not present

## 2018-01-01 MED ORDER — KETOROLAC TROMETHAMINE 15 MG/ML IJ SOLN
15.0000 mg | Freq: Once | INTRAMUSCULAR | Status: AC
  Administered 2018-01-01: 15 mg via INTRAMUSCULAR
  Filled 2018-01-01: qty 1

## 2018-01-01 NOTE — ED Provider Notes (Signed)
[facility] Provider Note   CSN: [id number] Arrival date & time: 01/01/18  [id number]     History   Chief Complaint Chief Complaint  Patient presents with  . Knee Pain    HPI Sharon Michael is a 72 y.o. female.  72 year old female with prior medical history as detailed below presents with complaint of left knee pain.  Patient reports long-standing history of bilateral knee arthritis.  She reports that her left knee has been bothering her more over the last several days.  She denies any specific inciting event or injury.  She denies fever.  She denies redness or significant swelling at the joint.  She is put a soft splint on the knee for symptomatic control.  She is taken Tylenol at home with some improvement in her symptoms.  She has seen [facility]us [facility]Black[facility] at [facility] in the past for joint related problems.  She reports that she has received a cortisone injection to the right knee previously from Dr. Magnus IvanBlackman with good results.  The history is provided by the patient and medical records.  Knee Pain   This is a recurrent problem. The current episode started more than 2 days ago. The problem occurs every several days. The problem has not changed since onset.The pain is present in the left knee. The quality of the pain is described as aching. The pain is mild. Associated symptoms include itching. Pertinent negatives include no numbness, full range of motion, no stiffness and no tingling. She has tried OTC pain medications for the symptoms.    Past Medical History:  Diagnosis Date  . Diabetes mellitus without complication (HCC)   . Hyperlipidemia   . Hypertension     Patient Active Problem List   Diagnosis Date Noted  . Chronic pain of left knee 02/08/2017  . Unilateral primary osteoarthritis, left knee 02/08/2017  . Type 2 diabetes, controlled, with neuropathy (HCC) 03/19/2013    Past Surgical History:  Procedure Laterality Date  .  ABDOMINAL HYSTERECTOMY       OB History   None      Home Medications    Prior to Admission medications   Medication Sig Start Date End Date Taking? Authorizing Provider  atorvastatin (LIPITOR) 40 MG tablet  06/22/16   [provider]  latanoprost (XALATAN) 0.005 % ophthalmic solution  11/06/15   [provider]  metFORMIN (GLUCOPHAGE) 500 MG tablet Take by mouth 2 (two) times daily with a meal.    [provider]  metoprolol succinate (TOPROL-XL) 50 MG 24 hr tablet Take 50 mg by mouth daily. Take with or immediately following a meal.    [provider]  olmesartan-hydrochlorothiazide (BENICAR HCT) 40-25 MG per tablet Take 1 tablet by mouth daily.    [provider]  telmisartan (MICARDIS) 80 MG tablet TK 1/2 T PO QD 10/09/15   [provider]  traMADol (ULTRAM) 50 MG tablet Take 1 tablet (50 mg total) by mouth every 8 (eight) hours as needed. Patient not taking: Reported on 06/29/2016 10/14/14   Lenn Sinkegal, Norman S, DPM    Family History Family History  Problem Relation Age of Onset  . Kidney disease Mother   . Hypertension Mother   . Diabetes Mother   . Heart disease Father   . Hypertension Father   . Diabetes Father   . Kidney disease Sister   . Cancer Brother     Social History Social History   Tobacco Use  . Smoking status:  Never Smoker  . Smokeless tobacco: Never Used  Substance Use Topics  . Alcohol use: No  . Drug use: No     Allergies   Patient has no known allergies.   Review of Systems Review of Systems  Musculoskeletal: Positive for arthralgias. Negative for stiffness.  Skin: Positive for itching.  Neurological: Negative for tingling and numbness.  All other systems reviewed and are negative.    Physical Exam Updated Vital Signs BP (!) 213/80 (BP Location: Right Arm)   Pulse (!) 56   Temp 97.8 F (36.6 C) (Oral)   Resp 18   SpO2 100%   Physical Exam  Constitutional: She is oriented to person,  place, and time. She appears well-developed and well-nourished. No distress.  HENT:  Head: Normocephalic and atraumatic.  Mouth/Throat: Oropharynx is clear and moist.  Eyes: Pupils are equal, round, and reactive to light. Conjunctivae and EOM are normal.  Neck: Normal range of motion. Neck supple.  Cardiovascular: Normal rate, regular rhythm and normal heart sounds.  Pulmonary/Chest: Effort normal and breath sounds normal. No respiratory distress.  Abdominal: Soft. She exhibits no distension. There is no tenderness.  Musculoskeletal: Normal range of motion. She exhibits no edema or deformity.  Left knee with full active range of motion No appreciable joint effusion. No appreciable erythema overlying the joint.  Mild diffuse knee tenderness reported with palpation. Distal left lower extremity is neurovascular intact.  Patient is ambulatory with mild antalgic gait.    Neurological: She is alert and oriented to person, place, and time.  Skin: Skin is warm and dry.  Psychiatric: She has a normal mood and affect.  Nursing note and vitals reviewed.    ED Treatments / Results  Labs (all labs ordered are listed, but only abnormal results are displayed) Labs Reviewed - No data to display  EKG None  Radiology No results found.  Procedures Procedures (including critical care time)  Medications Ordered in ED Medications  ketorolac (TORADOL) 15 MG/ML injection 15 mg (has no administration in time range)     Initial Impression / Assessment and Plan / ED Course  I have reviewed the triage vital signs and the nursing notes.  Pertinent labs & imaging results that were available during my care of the patient were reviewed by me and considered in my medical decision making (see chart for details).     MDM  Screen complete  She is presenting for evaluation of her left knee pain.  Patient has long-standing history of arthritis.  Pain today is secondary to same.  Screening x-ray  does not reveal any other acute pathology.  Patient has [facility].  Patient understands need for close follow-up with Dr. Magnus IvanBlackman.  Patient feels improved following medication in the ED.  She desires discharge home.  Importance of close follow-up is stressed. Final Clinical Impressions(s) / ED Diagnoses   Final diagnoses:  Chronic pain of left knee    ED Discharge Orders    None       Wynetta FinesMessick, Peter C, MD 01/01/18 1000

## 2018-01-01 NOTE — Discharge Instructions (Addendum)
Please return for any problem. Follow up with Dr. Magnus IvanBlackman today as instructed.

## 2018-01-01 NOTE — ED Triage Notes (Signed)
Pt has swelling and pain in her left knee.  Pt states she has arthritis in her right knee but now her left knee is causing her pain.

## 2018-01-03 ENCOUNTER — Ambulatory Visit: Payer: Medicare HMO | Admitting: Podiatry

## 2018-01-03 ENCOUNTER — Encounter: Payer: Self-pay | Admitting: Podiatry

## 2018-01-03 DIAGNOSIS — B351 Tinea unguium: Secondary | ICD-10-CM | POA: Diagnosis not present

## 2018-01-03 DIAGNOSIS — Q828 Other specified congenital malformations of skin: Secondary | ICD-10-CM | POA: Diagnosis not present

## 2018-01-03 DIAGNOSIS — E1140 Type 2 diabetes mellitus with diabetic neuropathy, unspecified: Secondary | ICD-10-CM

## 2018-01-03 NOTE — Progress Notes (Signed)
Complaint:  Visit Type: Patient returns to my office for continued preventative foot care services. Complaint: Patient states" my nails have grown long and thick and become painful to walk and wear shoes" Patient has been diagnosed with DM with no foot complications. The patient presents for preventative foot care services. No changes to ROS  Podiatric Exam: Vascular: dorsalis pedis and posterior tibial pulses are palpable bilateral. Capillary return is immediate. Temperature gradient is WNL. Skin turgor WNL  Sensorium: Normal Semmes Weinstein monofilament test. Normal tactile sensation bilaterally. Nail Exam: Pt has thick disfigured discolored nails with subungual debris noted bilateral entire nail hallux through fifth toenails Ulcer Exam: There is no evidence of ulcer or pre-ulcerative changes or infection. Orthopedic Exam: Muscle tone and strength are WNL. No limitations in general ROM. No crepitus or effusions noted. Foot type and digits show no abnormalities. Bony prominences are unremarkable. Skin:  Porokeratosis sub 5th left foot .Marland Kitchen. No infection or ulcers  Diagnosis:  Onychomycosis, , Pain in right toe, pain in left toes.  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 The patient understood the discussion of treatment and procedures well. All questions were answered thoroughly reviewed. Debridement of mycotic and hypertrophic toenails, 1 through 5 bilateral and clearing of subungual debris. No ulceration, no infection noted.  Debride porokeratosis.  To schedule nail surgery in future. Return Visit-Office Procedure: Patient instructed to return to the office for a follow up visit 10 months  for continued evaluation and treatment.    Helane GuntherGregory Alannah Averhart DPM

## 2018-01-12 ENCOUNTER — Encounter: Payer: Self-pay | Admitting: Podiatry

## 2018-01-12 ENCOUNTER — Ambulatory Visit: Payer: Medicare HMO | Admitting: Podiatry

## 2018-01-12 DIAGNOSIS — B351 Tinea unguium: Secondary | ICD-10-CM

## 2018-01-12 DIAGNOSIS — E1140 Type 2 diabetes mellitus with diabetic neuropathy, unspecified: Secondary | ICD-10-CM | POA: Diagnosis not present

## 2018-01-12 DIAGNOSIS — L600 Ingrowing nail: Secondary | ICD-10-CM | POA: Diagnosis not present

## 2018-01-12 NOTE — Progress Notes (Signed)
Complaint:  Visit Type: Patient returns to my office for scheduled nail surgery for the removal of the toenail, second toe right foot.  This nail is very thick and painful and she desires removal of this toenail.  Podiatric Exam: Vascular: dorsalis pedis and posterior tibial pulses are palpable bilateral. Capillary return is immediate. Temperature gradient is WNL. Skin turgor WNL  Sensorium: Normal Semmes Weinstein monofilament test. Normal tactile sensation bilaterally. Nail Exam: Pt has thick disfigured discolored nails with subungual debris noted bilateral entire nail hallux through fifth toenails,  Especially second toenail right foot. Ulcer Exam: There is no evidence of ulcer or pre-ulcerative changes or infection. Orthopedic Exam: Muscle tone and strength are WNL. No limitations in general ROM. No crepitus or effusions noted. Foot type and digits show no abnormalities. Bony prominences are unremarkable. Skin:  Porokeratosis sub 5th left foot .Marland Kitchen. No infection or ulcers  Diagnosis:  Onychomycosis second toenail right foot.  Treatment & Plan ROV.  Nail surgery.  Treatment options and alternatives discussed.  Recommended permanent phenol matrixectomy and patient agreed. Right second toe  was prepped with alcohol and a toe block of 3cc of 2% lidocaine plain was administered in a digital toe block. .  The toe was then prepped with betadine solution . A tourniquet was applied to toe. The offending nail  was then excised and matrix tissue exposed.  Phenol was then applied to the matrix tissue followed by an alcohol wash.  Antibiotic ointment and a dry sterile dressing was applied.  The patient was dispensed instructions for aftercare.      Helane GuntherGregory Elizardo Chilson DPM     Helane GuntherGregory Tiajuana Leppanen DPM

## 2018-01-12 NOTE — Patient Instructions (Signed)

## 2018-01-19 ENCOUNTER — Ambulatory Visit: Payer: Medicare HMO | Admitting: Podiatry

## 2018-01-19 ENCOUNTER — Encounter: Payer: Self-pay | Admitting: Podiatry

## 2018-01-19 DIAGNOSIS — E1140 Type 2 diabetes mellitus with diabetic neuropathy, unspecified: Secondary | ICD-10-CM

## 2018-01-19 DIAGNOSIS — Z09 Encounter for follow-up examination after completed treatment for conditions other than malignant neoplasm: Secondary | ICD-10-CM

## 2018-01-19 NOTE — Progress Notes (Signed)
This patient returns to the office following nail surgery one week ago.  The patient says toe has been soaked and bandaged as directed.  There has been improvement of the toe since the surgery has been performed. The patient presents for continued evaluation and treatment.  She says the surgical site is achy in the night.  Patient is diabetic.  GENERAL APPEARANCE: Alert, conversant. Appropriately groomed. No acute distress.  VASCULAR: Pedal pulses palpable at  [facility] and PT bilateral.  Capillary refill time is immediate to all digits,  Normal temperature gradient.    NEUROLOGIC: sensation is normal to 5.07 monofilament at 5/5 sites bilateral.  Light touch is intact bilateral, Muscle strength normal.  MUSCULOSKELETAL: acceptable muscle strength, tone and stability bilateral.  Intrinsic muscluature intact bilateral.  Rectus appearance of foot and digits noted bilateral.   DERMATOLOGIC: skin color, texture, and turgor are within normal limits.  No preulcerative lesions or ulcers  are seen, no interdigital maceration noted.   NAILS  There is necrotic tissue along the nail groove  In the absence of redness swelling and pain.  DX  S/p nail surgery  ROV  Home instructions were discussed.  Patient to call the office if there are any questions or concerns. Told patient to peroxide surgical site.  The surgical site will get less achy as the toe continues to heal.  RTC 2 weeks   Helane Gunther DPM

## 2018-02-02 ENCOUNTER — Ambulatory Visit: Payer: Self-pay | Admitting: Podiatry

## 2018-02-28 ENCOUNTER — Ambulatory Visit
Admission: RE | Admit: 2018-02-28 | Discharge: 2018-02-28 | Disposition: A | Discharge status: Home / Self Care | Payer: Medicare HMO | Source: Ambulatory Visit | Attending: Internal Medicine | Admitting: Internal Medicine

## 2018-02-28 DIAGNOSIS — Z139 Encounter for screening, unspecified: Secondary | ICD-10-CM

## 2018-04-04 ENCOUNTER — Encounter: Payer: Self-pay | Admitting: Podiatry

## 2018-04-04 ENCOUNTER — Ambulatory Visit: Payer: Medicare HMO | Admitting: Podiatry

## 2018-04-04 DIAGNOSIS — B351 Tinea unguium: Secondary | ICD-10-CM

## 2018-04-04 DIAGNOSIS — E1140 Type 2 diabetes mellitus with diabetic neuropathy, unspecified: Secondary | ICD-10-CM | POA: Diagnosis not present

## 2018-04-04 NOTE — Progress Notes (Signed)
Complaint:  Visit Type: Patient returns to my office for continued preventative foot care services. Complaint: Patient states" my nails have grown long and thick and become painful to walk and wear shoes" Patient has been diagnosed with DM with no foot complications. The patient presents for preventative foot care services. No changes to ROS  Podiatric Exam: Vascular: dorsalis pedis and posterior tibial pulses are palpable bilateral. Capillary return is immediate. Temperature gradient is WNL. Skin turgor WNL  Sensorium: Normal Semmes Weinstein monofilament test. Normal tactile sensation bilaterally. Nail Exam: Pt has thick disfigured discolored nails with subungual debris noted bilateral entire nail hallux through fifth toenails.  Toenail second right foot is healing with no infection noted. Ulcer Exam: There is no evidence of ulcer or pre-ulcerative changes or infection. Orthopedic Exam: Muscle tone and strength are WNL. No limitations in general ROM. No crepitus or effusions noted. Foot type and digits show no abnormalities. Bony prominences are unremarkable. Skin:  Porokeratosis sub 5th left foot .Marland Kitchen. No infection or ulcers  Diagnosis:  Onychomycosis, , Pain in right toe, pain in left toes.    Treatment & Plan Procedures and Treatment: Consent by patient was obtained for treatment procedures. The patient understood the discussion of treatment and procedures well. All questions were answered thoroughly reviewed. Debridement of mycotic and hypertrophic toenails, 1 through 5 bilateral and clearing of subungual debris. No ulceration, no infection noted.     Return Visit-Office Procedure: Patient instructed to return to the office for a follow up visit 3  months  for continued evaluation and treatment.    Helane GuntherGregory Shonda Mandarino DPM

## 2018-04-25 ENCOUNTER — Encounter (INDEPENDENT_AMBULATORY_CARE_PROVIDER_SITE_OTHER): Payer: Self-pay | Admitting: Physician Assistant

## 2018-04-25 ENCOUNTER — Ambulatory Visit (INDEPENDENT_AMBULATORY_CARE_PROVIDER_SITE_OTHER): Payer: Medicare HMO | Admitting: Physician Assistant

## 2018-04-25 DIAGNOSIS — M1711 Unilateral primary osteoarthritis, right knee: Secondary | ICD-10-CM | POA: Diagnosis not present

## 2018-04-25 MED ORDER — METHYLPREDNISOLONE ACETATE 40 MG/ML IJ SUSP
40.0000 mg | INTRAMUSCULAR | Status: AC | PRN
Start: 2018-04-25 — End: 2018-04-25
  Administered 2018-04-25: 40 mg via INTRA_ARTICULAR

## 2018-04-25 MED ORDER — LIDOCAINE HCL 1 % IJ SOLN
5.0000 mL | INTRAMUSCULAR | Status: AC | PRN
  Administered 2018-04-25: 5 mL

## 2018-04-25 NOTE — Progress Notes (Signed)
   Procedure Note  Patient: Sharon Michael             Date of Birth: 03/10/1946           MRN: [id number]             Visit Date: 04/25/2018 HPI: [name] returns today due to right knee pain and swelling.  She has known osteoarthritis of the right knee.  She was last seen 11/09/2017 and was given an injection in the right knee.  She notes the swelling just is not going down the knee.  Pain returned in August after the June injection.  She is tried a knee brace.  She has had no known injury.  Review of systems: Denies any recent fever chest pain shortness of breath.  Positive for chills.  Physical exam right knee she has full extension flexion beyond 90 degrees.  Global tenderness.  Positive effusion.  No abnormal warmth erythema or ecchymosis.   Procedures: Visit Diagnoses: Unilateral primary osteoarthritis, right knee  Large Joint Inj: R knee on 04/25/2018 3:08 PM Indications: pain Details: 22 G 1.5 in needle, anterolateral approach  Arthrogram: No  Medications: 40 mg methylPREDNISolone acetate 40 MG/ML; 5 mL lidocaine 1 % Aspirate: 60 mL yellow Outcome: tolerated well, no immediate complications Procedure, treatment alternatives, risks and benefits explained, specific risks discussed. Consent was given by the patient. Immediately prior to procedure a time out was called to verify the correct patient, procedure, equipment, support staff and site/side marked as required. Patient was prepped and draped in the usual sterile fashion.     Plan she understands that she can have cortisone injection in the right knee every 3 months.  She will return on a as needed basis.

## 2018-05-18 ENCOUNTER — Ambulatory Visit (INDEPENDENT_AMBULATORY_CARE_PROVIDER_SITE_OTHER): Payer: Medicare HMO | Admitting: Family Medicine

## 2018-05-18 ENCOUNTER — Encounter (INDEPENDENT_AMBULATORY_CARE_PROVIDER_SITE_OTHER): Payer: Self-pay | Admitting: Family Medicine

## 2018-05-18 DIAGNOSIS — M1711 Unilateral primary osteoarthritis, right knee: Secondary | ICD-10-CM

## 2018-05-18 MED ORDER — NABUMETONE 500 MG PO TABS: 500.0000 mg | ORAL_TABLET | Freq: Two times a day (BID) | ORAL | 3 refills | Status: DC | PRN

## 2018-05-18 MED ORDER — DICLOFENAC SODIUM 1 % TD GEL: 4.0000 g | Freq: Four times a day (QID) | TRANSDERMAL | 6 refills | Status: DC | PRN

## 2018-05-18 NOTE — Progress Notes (Signed)
   Office Visit Note   Patient: Sharon Michael           Date of Birth: 04-10-1946           MRN: [id number] Visit Date: 05/18/2018 Requested by: Fleet Contras, MD [address] [place], [place] [address] PCP: Fleet Contras, MD  Subjective: Chief Complaint  Patient presents with  . Right Knee - Pain    Sharp, shooting pain in anterior knee when started to stand up 05/14/18. Knee stays swollen. Had aspiration/cortisone injection 04/25/18 by Rexene Edison, PA.    HPI: She is here with recurrent right knee pain.  She had it aspirated and injected December 11.  She felt fine for a couple weeks, but then 1 day she felt a sharp stabbing pain in the front of her knee along with recurrent swelling.  It has improved since then but it still swollen.  She is frustrated by her recurring symptoms.  She is not taking anything but Tylenol for her pain.              ROS: She has diabetes pretty well controlled.  Other systems were negative.  Objective: Vital Signs: There were no vitals taken for this visit.  Physical Exam:  Right knee: 2+ effusion with no warmth or erythema.  No pain with patella apprehension or compression.  Minimal medial compartment tenderness, no palpable click with McMurray's.  Ligaments feel stable.  Imaging: None  Assessment & Plan: 1.  Recurrent right knee pain with effusion, probably related to osteoarthritis but could have degenerative meniscus tear or loose body -We will try anti-inflammatories.  If symptoms persist or recur, then she will contact us to order MRI scan.   Follow-Up Instructions: No follow-ups on file.      Procedures: No procedures performed  No notes on file    PMFS History: Patient Active Problem List   Diagnosis Date Noted  . Chronic pain of left knee 02/08/2017  . Unilateral primary osteoarthritis, left knee 02/08/2017  . 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  Diabetes mellitus without complication (HCC)   . Hyperlipidemia   . Hypertension     Family History  Problem Relation Age of Onset  . Kidney disease Mother   . Hypertension Mother   . Diabetes Mother   . Heart disease Father   . Hypertension Father   . Diabetes Father   . Kidney disease Sister   . Cancer Brother     Past Surgical History:  Procedure Laterality Date  . ABDOMINAL HYSTERECTOMY    . BREAST BIOPSY Right    Social History   Occupational History  . Not on file  Tobacco Use  . Smoking status: Never Smoker  . Smokeless tobacco: Never Used  Substance and Sexual Activity  . Alcohol use: No  . Drug use: No  . Sexual activity: Not on file

## 2018-06-13 ENCOUNTER — Telehealth (INDEPENDENT_AMBULATORY_CARE_PROVIDER_SITE_OTHER): Payer: Self-pay

## 2018-06-13 ENCOUNTER — Ambulatory Visit (INDEPENDENT_AMBULATORY_CARE_PROVIDER_SITE_OTHER): Payer: Medicare HMO | Admitting: Orthopaedic Surgery

## 2018-06-13 ENCOUNTER — Encounter (INDEPENDENT_AMBULATORY_CARE_PROVIDER_SITE_OTHER): Payer: Self-pay | Admitting: Orthopaedic Surgery

## 2018-06-13 DIAGNOSIS — M1711 Unilateral primary osteoarthritis, right knee: Secondary | ICD-10-CM

## 2018-06-13 NOTE — Progress Notes (Signed)
   Office Visit Note   Patient: Donalee Moreland           Date of Birth: 1945/08/25           MRN: [id number] Visit Date: 06/13/2018              Requested by: Fleet Contras, MD [address] [place], [place] [address] PCP: Fleet Contras, MD   Assessment & Plan: Visit Diagnoses:  1. Unilateral primary osteoarthritis, right knee     Plan: We will try to obtain a supplemental injection for her knee and have her back once this is available.  She put on light duty work immediately with the restrictions of no lifting greater than 40 pounds.  Discussed with her quad strengthening exercises.  Questions were encouraged and answered at length.  Follow-Up Instructions: Return for Supplemental injection.   Orders:  No orders of the defined types were placed in this encounter.  No orders of the defined types were placed in this encounter.     Procedures: No procedures performed   Clinical Data: No additional findings.   Subjective: Chief Complaint  Patient presents with  . Right Knee - Follow-up    HPI Mrs. Boney returns today stating that the injection in November in her right knee helped for a couple of weeks and then her pain returned.  She saw Dr. Prince Rome and had a recurrent effusion.  She continues to have pain in the knee despite wearing a knee sleeve and time.  She feels that knee could give way at times but has no other mechanical symptoms.  On radiograph she has tricompartmental arthritis of the right knee with periarticular osteophytes lateral joint line and severe patellofemoral arthritis.  Review of Systems See HPI otherwise negative  Objective: Vital Signs: There were no vitals taken for this visit.  Physical Exam General: Well-developed well-nourished female no acute distress mood affect appropriate. Ortho Exam Bilateral knees good range of motion.  Right knee positive effusion left knee no effusion no abnormal warmth erythema of either knee.  Nontender  along medial lateral joint line of both knees.  Peripatellar tenderness right knee only.  Patellofemoral crepitus bilateral knees with passive range of motion.  No instability valgus varus stress either knee.   Specialty Comments:  No specialty comments available.  Imaging: No results found.   PMFS History: Patient Active Problem List   Diagnosis Date Noted  . Chronic pain of left knee 02/08/2017  . Unilateral primary osteoarthritis, left knee 02/08/2017  . 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 Diabetes mellitus without complication (HCC)   . Hyperlipidemia   . Hypertension     Family History  Problem Relation Age of Onset  . Kidney disease Mother   . Hypertension Mother   . Diabetes Mother   . Heart disease Father   . Hypertension Father   . Diabetes Father   . Kidney disease Sister   . Cancer Brother     Past Surgical History:  Procedure Laterality Date  . ABDOMINAL HYSTERECTOMY    . BREAST BIOPSY Right    Social History   Occupational History  . Not on file  Tobacco Use  . Smoking status: Never Smoker  . Smokeless tobacco: Never Used  Substance and Sexual Activity  . Alcohol use: No  . Drug use: No  . Sexual activity: Not on file

## 2018-06-13 NOTE — Telephone Encounter (Signed)
Right knee gel injection  

## 2018-06-19 NOTE — Telephone Encounter (Signed)
Noted  

## 2018-06-29 ENCOUNTER — Telehealth (INDEPENDENT_AMBULATORY_CARE_PROVIDER_SITE_OTHER): Payer: Self-pay

## 2018-06-29 NOTE — Telephone Encounter (Signed)
Submitted VOB for Monovisc, right knee. 

## 2018-07-03 ENCOUNTER — Telehealth (INDEPENDENT_AMBULATORY_CARE_PROVIDER_SITE_OTHER): Payer: Self-pay

## 2018-07-03 NOTE — Telephone Encounter (Signed)
Talked with patient and advised her that she is approved for gel injection.  Approved for Monovisc, right knee. Buy & Bill Patient will be responsible for 20% OOP. Co-pay of $45.00 No PA required  Appt. 07/16/2018 with Dr. Magnus Ivan

## 2018-07-04 ENCOUNTER — Ambulatory Visit: Payer: Medicare HMO | Admitting: Podiatry

## 2018-07-04 ENCOUNTER — Encounter: Payer: Self-pay | Admitting: Podiatry

## 2018-07-04 DIAGNOSIS — B351 Tinea unguium: Secondary | ICD-10-CM | POA: Diagnosis not present

## 2018-07-04 DIAGNOSIS — M79676 Pain in unspecified toe(s): Secondary | ICD-10-CM | POA: Diagnosis not present

## 2018-07-04 DIAGNOSIS — E1140 Type 2 diabetes mellitus with diabetic neuropathy, unspecified: Secondary | ICD-10-CM

## 2018-07-04 NOTE — Progress Notes (Signed)
Complaint:  Visit Type: Patient returns to my office for continued preventative foot care services. Complaint: Patient states" my nails have grown long and thick and become painful to walk and wear shoes" Patient has been diagnosed with DM with no foot complications. The patient presents for preventative foot care services. No changes to ROS  Podiatric Exam: Vascular: dorsalis pedis and posterior tibial pulses are palpable bilateral. Capillary return is immediate. Temperature gradient is WNL. Skin turgor WNL  Sensorium: Normal Semmes Weinstein monofilament test. Normal tactile sensation bilaterally. Nail Exam: Pt has thick disfigured discolored nails with subungual debris noted bilateral entire nail hallux through fifth toenails.  Toenail second right foot is healing with no infection noted. Ulcer Exam: There is no evidence of ulcer or pre-ulcerative changes or infection. Orthopedic Exam: Muscle tone and strength are WNL. No limitations in general ROM. No crepitus or effusions noted. Foot type and digits show no abnormalities. Bony prominences are unremarkable. Skin:  Porokeratosis sub 5th left foot .Marland Kitchen No infection or ulcers  Diagnosis:  Onychomycosis, , Pain in right toe, pain in left toes.    Treatment & Plan Procedures and Treatment: Consent by patient was obtained for treatment procedures. The patient understood the discussion of treatment and procedures well. All questions were answered thoroughly reviewed. Debridement of mycotic and hypertrophic toenails, 1 through 5 bilateral and clearing of subungual debris. No ulceration, no infection noted.  Nail bed second right is healing with callus at site of nail bed.   Return Visit-Office Procedure: Patient instructed to return to the office for a follow up visit 3  months  for continued evaluation and treatment.    Helane Gunther DPM

## 2018-07-16 ENCOUNTER — Ambulatory Visit (INDEPENDENT_AMBULATORY_CARE_PROVIDER_SITE_OTHER): Payer: Medicare HMO | Admitting: Orthopaedic Surgery

## 2018-07-16 ENCOUNTER — Encounter (INDEPENDENT_AMBULATORY_CARE_PROVIDER_SITE_OTHER): Payer: Self-pay | Admitting: Orthopaedic Surgery

## 2018-07-16 DIAGNOSIS — M1711 Unilateral primary osteoarthritis, right knee: Secondary | ICD-10-CM | POA: Insufficient documentation

## 2018-07-16 MED ORDER — LIDOCAINE HCL 1 % IJ SOLN
3.0000 mL | INTRAMUSCULAR | Status: AC | PRN
  Administered 2018-07-16: 3 mL

## 2018-07-16 MED ORDER — HYALURONAN 88 MG/4ML IX SOSY
88.0000 mg | PREFILLED_SYRINGE | INTRA_ARTICULAR | Status: AC | PRN
  Administered 2018-07-16: 88 mg via INTRA_ARTICULAR

## 2018-07-16 NOTE — Progress Notes (Signed)
   Procedure Note  Patient: Sharon Michael             Date of Birth: 25-Feb-1946           MRN: [id number]             Visit Date: 07/16/2018  Procedures: Visit Diagnoses: No diagnosis found.  Large Joint Inj: R knee on 07/16/2018 8:53 AM Indications: diagnostic evaluation and pain Details: 22 G 1.5 in needle, superolateral approach  Arthrogram: No  Medications: 3 mL lidocaine 1 %; 88 mg Hyaluronan 88 MG/4ML Outcome: tolerated well, no immediate complications Procedure, treatment alternatives, risks and benefits explained, specific risks discussed. Consent was given by the patient. Immediately prior to procedure a time out was called to verify the correct patient, procedure, equipment, support staff and site/side marked as required. Patient was prepped and draped in the usual sterile fashion.    The patient is here today for scheduled hyaluronic acid injection with Monovisc to treat the pain in the right knee from osteoarthritis.  She understands fully while she is having this type of injection.  She is work on activity modification as well as quad strengthening activities.  Her knee on exam today shows no effusion but global tenderness.  She does wear knee sleeve as well.  She tolerated the hyaluronic acid injection of Monovisc in her right knee without difficulty.  All questions concerns were answered and addressed.  I gave her instructions about following up as needed unless she is having worsening problems at any time with that knee.  All question concerns were answered addressed.

## 2018-08-08 ENCOUNTER — Other Ambulatory Visit: Payer: Self-pay

## 2018-08-08 ENCOUNTER — Ambulatory Visit: Payer: Medicare HMO | Admitting: Podiatry

## 2018-08-08 ENCOUNTER — Encounter (INDEPENDENT_AMBULATORY_CARE_PROVIDER_SITE_OTHER): Payer: Self-pay | Admitting: Orthopaedic Surgery

## 2018-08-08 ENCOUNTER — Ambulatory Visit (INDEPENDENT_AMBULATORY_CARE_PROVIDER_SITE_OTHER): Payer: Medicare HMO | Admitting: Orthopaedic Surgery

## 2018-08-08 ENCOUNTER — Encounter: Payer: Self-pay | Admitting: Podiatry

## 2018-08-08 DIAGNOSIS — Q828 Other specified congenital malformations of skin: Secondary | ICD-10-CM

## 2018-08-08 DIAGNOSIS — M25461 Effusion, right knee: Secondary | ICD-10-CM

## 2018-08-08 DIAGNOSIS — M216X2 Other acquired deformities of left foot: Secondary | ICD-10-CM

## 2018-08-08 DIAGNOSIS — M779 Enthesopathy, unspecified: Secondary | ICD-10-CM | POA: Diagnosis not present

## 2018-08-08 DIAGNOSIS — E1140 Type 2 diabetes mellitus with diabetic neuropathy, unspecified: Secondary | ICD-10-CM

## 2018-08-08 DIAGNOSIS — M1711 Unilateral primary osteoarthritis, right knee: Secondary | ICD-10-CM

## 2018-08-08 MED ORDER — METHYLPREDNISOLONE ACETATE 40 MG/ML IJ SUSP
40.0000 mg | INTRAMUSCULAR | Status: AC | PRN
  Administered 2018-08-08: 40 mg via INTRA_ARTICULAR

## 2018-08-08 MED ORDER — LIDOCAINE HCL 1 % IJ SOLN
3.0000 mL | INTRAMUSCULAR | Status: AC | PRN
  Administered 2018-08-08: 3 mL

## 2018-08-08 NOTE — Progress Notes (Signed)
Complaint:  Visit Type: Patient returns to my office for continued preventative foot care services. Complaint: Patient states the callus on the outside of her left foot has become painful when walking.  She says she is unable to self treat.  Patient presents today for treatment of her painful callus left foot.  Podiatric Exam: Vascular: dorsalis pedis and posterior tibial pulses are palpable bilateral. Capillary return is immediate. Temperature gradient is WNL. Skin turgor WNL  Sensorium: Normal Semmes Weinstein monofilament test. Normal tactile sensation bilaterally. Nail Exam: Pt has thick disfigured discolored nails with subungual debris noted bilateral entire nail hallux through fifth toenails Ulcer Exam: There is no evidence of ulcer or pre-ulcerative changes or infection. Orthopedic Exam: Muscle tone and strength are WNL. No limitations in general ROM. No crepitus or effusions noted. Foot type and digits show no abnormalities. Bony prominences are unremarkable. Skin:  Porokeratosis sub 5th left foot .Marland Kitchen No infection or ulcers  Diagnosis:    Porokeratosis sub 5th left  Capsulitis left foot.  Treatment & Plan Procedures and Treatment: Consent by patient was obtained for treatment procedures. Debridement of porokeratosis sub 5th met left foot was performed.  Injection therapy left foot.  Injection therapy using 0.5  cc. Of 2% xylocaine( 10 mg.) plus 0.5  cc. of kenalog-la ( 5 mg) plus 1/2 cc. of dexamethazone phosphate ( 1 mg) Return Visit-Office Procedure: Patient instructed to return to the office for a follow up visit for regularly scheduled preventative foot care services.      Gardiner Barefoot DPM

## 2018-08-08 NOTE — Progress Notes (Signed)
Office Visit Note   Patient: Sharon Michael           Date of Birth: 1946-04-17           MRN: [id number] Visit Date: 08/08/2018              Requested by: Fleet Contras, MD [address] [place], [place] [address] PCP: Fleet Contras, MD   Assessment & Plan: Visit Diagnoses:  1. Unilateral primary osteoarthritis, right knee   2. Effusion, right knee     Plan: I was able to aspirate about 40 cc of serous fluid from the knee consistent with her osteoarthritis.  I then placed a steroid injection in her knee after counseling about the risk and benefits of these injections.  At this point she is failing conservative treatment and is going to consider knee replacement at some point.  All question concerns were answered and addressed.  Follow-up as otherwise as needed.  I did explain some things that she can do to try to offload her knee and other over-the-counter medications that she can try as well as activity modification and quad strengthening.  Follow-Up Instructions: Return if symptoms worsen or fail to improve.   Orders:  Orders Placed This Encounter  Procedures  . Large Joint Inj   No orders of the defined types were placed in this encounter.     Procedures: Large Joint Inj: R knee on 08/08/2018 1:12 PM Indications: diagnostic evaluation and pain Details: 22 G 1.5 in needle, superolateral approach  Arthrogram: No  Medications: 3 mL lidocaine 1 %; 40 mg methylPREDNISolone acetate 40 MG/ML Outcome: tolerated well, no immediate complications Procedure, treatment alternatives, risks and benefits explained, specific risks discussed. Consent was given by the patient. Immediately prior to procedure a time out was called to verify the correct patient, procedure, equipment, support staff and site/side marked as required. Patient was prepped and draped in the usual sterile fashion.       Clinical Data: No additional findings.   Subjective: Chief Complaint  Patient  presents with  . Right Knee - Follow-up  The patient comes in today with continued right knee pain with recurrent effusions.  She is 73 years old and has significant arthritis in her right knee.  She is tried and failed all forms conservative treatment.  Last month she recently had a hyaluronic acid injection with Monovisc in that knee.  None of this is helped.  She is a type II diabetic but reports good blood glucose control.  HPI  Review of Systems She does report recurrent effusion today in her knee.  She denies any headache, chest pain, shortness of breath, fever, chills, nausea, vomiting  Objective: Vital Signs: There were no vitals taken for this visit.  Physical Exam She is alert and orient x3 and in no acute distress Ortho Exam Examination of her right knee shows a moderate effusion.  Otherwise there is no redness with minimal warmth and good range of motion.  There is global tenderness and patellofemoral crepitation. Specialty Comments:  No specialty comments available.  Imaging: No results found.   PMFS History: Patient Active Problem List   Diagnosis Date Noted  . Unilateral primary osteoarthritis, right knee 07/16/2018  . Chronic pain of left knee 02/08/2017  . Unilateral primary osteoarthritis, left knee 02/08/2017  . 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 Diabetes mellitus without complication (HCC)   . Hyperlipidemia   .  Hypertension     Family History  Problem Relation Age of Onset  . Kidney disease Mother   . Hypertension Mother   . Diabetes Mother   . Heart disease Father   . Hypertension Father   . Diabetes Father   . Kidney disease Sister   . Cancer Brother     Past Surgical History:  Procedure Laterality Date  . ABDOMINAL HYSTERECTOMY    . BREAST BIOPSY Right    Social History   Occupational History  . Not on file  Tobacco Use  . Smoking status: Never Smoker  . Smokeless  tobacco: Never Used  Substance and Sexual Activity  . Alcohol use: No  . Drug use: No  . Sexual activity: Not on file

## 2018-12-12 ENCOUNTER — Other Ambulatory Visit: Payer: Self-pay

## 2018-12-12 ENCOUNTER — Encounter: Payer: Self-pay | Admitting: Podiatry

## 2018-12-12 ENCOUNTER — Ambulatory Visit: Payer: Medicare HMO | Admitting: Podiatry

## 2018-12-12 VITALS — Temp 97.9°F

## 2018-12-12 DIAGNOSIS — E1140 Type 2 diabetes mellitus with diabetic neuropathy, unspecified: Secondary | ICD-10-CM | POA: Diagnosis not present

## 2018-12-12 DIAGNOSIS — B351 Tinea unguium: Secondary | ICD-10-CM | POA: Diagnosis not present

## 2018-12-12 NOTE — Progress Notes (Signed)
Complaint:  Visit Type: Patient returns to my office for continued preventative foot care services. Complaint: Patient states" my nails have grown long and thick and become painful to walk and wear shoes" Patient has been diagnosed with DM with no foot complications. The patient presents for preventative foot care services. No changes to ROS  Podiatric Exam: Vascular: dorsalis pedis and posterior tibial pulses are palpable bilateral. Capillary return is immediate. Temperature gradient is WNL. Skin turgor WNL  Sensorium: Normal Semmes Weinstein monofilament test. Normal tactile sensation bilaterally. Nail Exam: Pt has thick disfigured discolored nails with subungual debris noted bilateral entire nail hallux through fifth toenails.  Toenail second right foot is healing with no infection noted. Ulcer Exam: There is no evidence of ulcer or pre-ulcerative changes or infection. Orthopedic Exam: Muscle tone and strength are WNL. No limitations in general ROM. No crepitus or effusions noted. Foot type and digits show no abnormalities. Bony prominences are unremarkable. Skin:  .. No infection or ulcers  Diagnosis:  Onychomycosis, , Pain in right toe, pain in left toes.    Treatment & Plan Procedures and Treatment: Consent by patient was obtained for treatment procedures. The patient understood the discussion of treatment and procedures well. All questions were answered thoroughly reviewed. Debridement of mycotic and hypertrophic toenails, 1 through 5 bilateral and clearing of subungual debris. No ulceration, no infection noted.     Return Visit-Office Procedure: Patient instructed to return to the office for a follow up visit 3  months  for continued evaluation and treatment.    Gardiner Barefoot DPM

## 2019-01-23 ENCOUNTER — Other Ambulatory Visit: Payer: Self-pay | Admitting: Internal Medicine

## 2019-01-23 ENCOUNTER — Other Ambulatory Visit (INDEPENDENT_AMBULATORY_CARE_PROVIDER_SITE_OTHER): Payer: Self-pay | Admitting: Family Medicine

## 2019-01-23 DIAGNOSIS — Z1231 Encounter for screening mammogram for malignant neoplasm of breast: Secondary | ICD-10-CM

## 2019-01-28 ENCOUNTER — Other Ambulatory Visit: Payer: Self-pay | Admitting: Family Medicine

## 2019-02-04 ENCOUNTER — Ambulatory Visit (INDEPENDENT_AMBULATORY_CARE_PROVIDER_SITE_OTHER): Payer: Medicare HMO | Admitting: Orthopaedic Surgery

## 2019-02-04 ENCOUNTER — Encounter: Payer: Self-pay | Admitting: Orthopaedic Surgery

## 2019-02-04 ENCOUNTER — Other Ambulatory Visit: Payer: Self-pay

## 2019-02-04 VITALS — Ht 65.0 in | Wt 188.0 lb

## 2019-02-04 DIAGNOSIS — M25461 Effusion, right knee: Secondary | ICD-10-CM

## 2019-02-04 DIAGNOSIS — M1711 Unilateral primary osteoarthritis, right knee: Secondary | ICD-10-CM

## 2019-02-04 MED ORDER — LIDOCAINE HCL 1 % IJ SOLN
3.0000 mL | INTRAMUSCULAR | Status: AC | PRN
  Administered 2019-02-04: 3 mL

## 2019-02-04 MED ORDER — METHYLPREDNISOLONE ACETATE 40 MG/ML IJ SUSP
40.0000 mg | INTRAMUSCULAR | Status: AC | PRN
  Administered 2019-02-04: 40 mg via INTRA_ARTICULAR

## 2019-02-04 NOTE — Progress Notes (Signed)
Office Visit Note   Patient: Sharon Michael           Date of Birth: 10-Jul-1945           MRN: [id number] Visit Date: 02/04/2019              Requested by: Nolene Ebbs, MD [address] [place],  [place] [address] PCP: Nolene Ebbs, MD   Assessment & Plan: Visit Diagnoses:  1. Unilateral primary osteoarthritis, right knee   2. Effusion, right knee     Plan: I was able to aspirate 40 cc of yellow fluid from the knee that was clear and consistent with osteoarthritis.  I then placed a steroid in her knee.  I gave her a work note stating that she should be restricted from lifting no greater than 20 pounds and this is a permanent restriction.  I talked her again about knee replacement surgery.  When he gets close to the point that she is ready to have this scheduled she will let us know.  We can always re-aspirate the knee and place a steroid in it again in 3 months from now if needed.  All question concerns were answered and addressed.  Follow-Up Instructions: Return if symptoms worsen or fail to improve.   Orders:  Orders Placed This Encounter  Procedures  . Large Joint Inj   No orders of the defined types were placed in this encounter.     Procedures: Large Joint Inj: R knee on 02/04/2019 4:38 PM Indications: diagnostic evaluation and pain Details: 22 G 1.5 in needle, superolateral approach  Arthrogram: No  Medications: 3 mL lidocaine 1 %; 40 mg methylPREDNISolone acetate 40 MG/ML Outcome: tolerated well, no immediate complications Procedure, treatment alternatives, risks and benefits explained, specific risks discussed. Consent was given by the patient. Immediately prior to procedure a time out was called to verify the correct patient, procedure, equipment, support staff and site/side marked as required. Patient was prepped and draped in the usual sterile fashion.       Clinical Data: No additional findings.   Subjective: Chief Complaint  Patient presents  with  . Right Knee - Pain  The patient is well-known to me.  She is 73 years old and does have known osteoarthritis with recurrent effusions of her right knee.  She is had another recurrent effusion.  Is been 6 months since we aspirated her knee and place a steroid injection in it.  She wants to schedule knee replacement once she gets approval for senior citizen care and housing.  She still works with [employer].  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  Her right knee has been swelling again.  She does need repeat aspiration and injection today as well.  She has well-documented severe osteoarthritis of her right knee.  HPI  Review of Systems She currently denies any headache, chest pain, shortness of breath, fever, chills, nausea, vomiting  Objective: Vital Signs: Ht 5\' 5"  (1.651 m)   Wt 188 lb (85.3 kg)   BMI 31.28 kg/m   Physical Exam She is alert and orient x3 and in no acute distress Ortho Exam Examination of her right knee does show moderate effusion.  She has slight valgus malalignment with good range of motion but painful globally. Specialty Comments:  No specialty comments available.  Imaging: No results  found.   PMFS History: Patient Active Problem List   Diagnosis Date Noted  . Unilateral primary osteoarthritis, right knee 07/16/2018  . Chronic pain of left knee 02/08/2017  . Unilateral primary osteoarthritis, left knee 02/08/2017  . 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 Diabetes mellitus without complication (HCC)   . Hyperlipidemia   . Hypertension     Family History  Problem Relation Age of Onset  . Kidney disease Mother   . Hypertension Mother   . Diabetes Mother   . Heart disease Father   . Hypertension Father   . Diabetes Father    . Kidney disease Sister   . Cancer Brother     Past Surgical History:  Procedure Laterality Date  . ABDOMINAL HYSTERECTOMY    . BREAST BIOPSY Right    Social History   Occupational History  . Not on file  Tobacco Use  . Smoking status: Never Smoker  . Smokeless tobacco: Never Used  Substance and Sexual Activity  . Alcohol use: No  . Drug use: No  . Sexual activity: Not on file

## 2019-03-06 ENCOUNTER — Ambulatory Visit
Admission: RE | Admit: 2019-03-06 | Discharge: 2019-03-06 | Disposition: A | Discharge status: Home / Self Care | Payer: Medicare HMO | Source: Ambulatory Visit | Attending: Internal Medicine | Admitting: Internal Medicine

## 2019-03-06 ENCOUNTER — Other Ambulatory Visit: Payer: Self-pay

## 2019-03-06 DIAGNOSIS — Z1231 Encounter for screening mammogram for malignant neoplasm of breast: Secondary | ICD-10-CM

## 2019-03-20 ENCOUNTER — Ambulatory Visit: Payer: Medicare HMO | Admitting: Podiatry

## 2019-03-20 ENCOUNTER — Encounter: Payer: Self-pay | Admitting: Podiatry

## 2019-03-20 ENCOUNTER — Other Ambulatory Visit: Payer: Self-pay

## 2019-03-20 DIAGNOSIS — M779 Enthesopathy, unspecified: Secondary | ICD-10-CM

## 2019-03-20 DIAGNOSIS — E1140 Type 2 diabetes mellitus with diabetic neuropathy, unspecified: Secondary | ICD-10-CM

## 2019-03-20 DIAGNOSIS — B351 Tinea unguium: Secondary | ICD-10-CM | POA: Diagnosis not present

## 2019-03-20 NOTE — Progress Notes (Signed)
Complaint:  Visit Type: Patient returns to my office for continued preventative foot care services. Complaint: Patient states" my nails have grown long and thick and become painful to walk and wear shoes" Patient has been diagnosed with DM with no foot complications. The patient presents for preventative foot care services. No changes to ROS  Podiatric Exam: Vascular: dorsalis pedis and posterior tibial pulses are palpable bilateral. Capillary return is immediate. Temperature gradient is WNL. Skin turgor WNL  Sensorium: Normal Semmes Weinstein monofilament test. Normal tactile sensation bilaterally. Nail Exam: Pt has thick disfigured discolored nails with subungual debris noted bilateral entire nail hallux through fifth toenails.  Toenail second right foot is healing with no infection noted. Ulcer Exam: There is no evidence of ulcer or pre-ulcerative changes or infection. Orthopedic Exam: Muscle tone and strength are WNL. No limitations in general ROM. No crepitus or effusions noted. Foot type and digits show no abnormalities. Bony prominences are unremarkable. Skin:  .. No infection or ulcers  Diagnosis:  Onychomycosis, , Pain in right toe, pain in left toes.    Treatment & Plan Procedures and Treatment: Consent by patient was obtained for treatment procedures. The patient understood the discussion of treatment and procedures well. All questions were answered thoroughly reviewed. Debridement of mycotic and hypertrophic toenails, 1 through 5 bilateral and clearing of subungual debris. No ulceration, no infection noted. Patient says she will hold off  on a shot in her left forefoot.   She will call later if the shot is needed. Return Visit-Office Procedure: Patient instructed to return to the office for a follow up visit 3  months  for continued evaluation and treatment.    Gardiner Barefoot DPM

## 2019-06-19 ENCOUNTER — Ambulatory Visit: Payer: Medicare HMO | Admitting: Podiatry

## 2019-06-19 ENCOUNTER — Other Ambulatory Visit: Payer: Self-pay

## 2019-06-19 ENCOUNTER — Encounter: Payer: Self-pay | Admitting: Podiatry

## 2019-06-19 DIAGNOSIS — E1140 Type 2 diabetes mellitus with diabetic neuropathy, unspecified: Secondary | ICD-10-CM | POA: Diagnosis not present

## 2019-06-19 DIAGNOSIS — Q828 Other specified congenital malformations of skin: Secondary | ICD-10-CM

## 2019-06-19 DIAGNOSIS — M779 Enthesopathy, unspecified: Secondary | ICD-10-CM

## 2019-06-19 DIAGNOSIS — B351 Tinea unguium: Secondary | ICD-10-CM | POA: Diagnosis not present

## 2019-06-19 NOTE — Progress Notes (Signed)
Complaint:  Visit Type: Patient returns to my office for continued preventative foot care services. Complaint: Patient states" my nails have grown long and thick and become painful to walk and wear shoes" Patient has been diagnosed with DM with no foot complications. The patient presents for preventative foot care services. No changes to ROS.  Patient has callus on outside ball of her left foot.  Podiatric Exam: Vascular: dorsalis pedis and posterior tibial pulses are palpable bilateral. Capillary return is immediate. Temperature gradient is WNL. Skin turgor WNL  Sensorium: Normal Semmes Weinstein monofilament test. Normal tactile sensation bilaterally. Nail Exam: Pt has thick disfigured discolored nails with subungual debris noted bilateral entire nail hallux through fifth toenails.   Ulcer Exam: There is no evidence of ulcer or pre-ulcerative changes or infection. Orthopedic Exam: Muscle tone and strength are WNL. No limitations in general ROM. No crepitus or effusions noted. Foot type and digits show no abnormalities. Bony prominences are unremarkable. Plantar flex 5th metatarsal left foot. Skin:  .. No infection or ulcers.  Porokeratosis sub 5th left foot.  Diagnosis:  Onychomycosis, , Pain in right toe, pain in left toes.  Porokeratosis sub 5th met left foot.  Treatment & Plan Procedures and Treatment: Consent by patient was obtained for treatment procedures. The patient understood the discussion of treatment and procedures well. All questions were answered thoroughly reviewed. Debridement of mycotic and hypertrophic toenails, 1 through 5 bilateral and clearing of subungual debris. No ulceration, no infection noted.  Return Visit-Office Procedure: Patient instructed to return to the office for a follow up visit 10 weeks   for continued evaluation and treatment.    Gardiner Barefoot DPM

## 2019-07-18 ENCOUNTER — Other Ambulatory Visit: Payer: Self-pay

## 2019-07-18 ENCOUNTER — Ambulatory Visit: Payer: Medicare PPO | Admitting: Physician Assistant

## 2019-07-18 ENCOUNTER — Encounter: Payer: Self-pay | Admitting: Physician Assistant

## 2019-07-18 DIAGNOSIS — M1711 Unilateral primary osteoarthritis, right knee: Secondary | ICD-10-CM | POA: Diagnosis not present

## 2019-07-18 DIAGNOSIS — M25461 Effusion, right knee: Secondary | ICD-10-CM

## 2019-07-18 MED ORDER — LIDOCAINE HCL 1 % IJ SOLN
5.0000 mL | INTRAMUSCULAR | Status: AC | PRN
  Administered 2019-07-18: 5 mL

## 2019-07-18 MED ORDER — METHYLPREDNISOLONE ACETATE 40 MG/ML IJ SUSP
40.0000 mg | INTRAMUSCULAR | Status: AC | PRN
  Administered 2019-07-18: 40 mg via INTRA_ARTICULAR

## 2019-07-18 NOTE — Progress Notes (Addendum)
   Procedure Note  Patient: Sharon Michael             Date of Birth: 04/29/1946           MRN: [id number]             Visit Date: 07/18/2019 HPI: Sharon Michael is well known to Dr. Eliberto Ivory the service comes in today due to right knee pain and swelling.  She was last seen 02/03/2025 given knee injection the knee was aspirated at that time.  She states that over the last 3 to 4 months she has had increased swelling pain in the knee.  She is having difficulty ambulating due to the increased pain and swelling.  No known injury.  She does have known osteoarthritis in the knee and is had recurrent effusions in the knee.  She is asking for aspiration injection in the knee.  Review of systems: Negative for fevers chills shortness of breath chest pain  Physical exam: General well-developed well-nourished female no acute distress psych alert and oriented x3.  Right knee positive effusion.  No abnormal warmth erythema.  Good range of motion but forced flexion causes pain.  Valgus deformity of the knee.  Procedures: Visit Diagnoses:  1. Unilateral primary osteoarthritis, right knee   2. Effusion, right knee     Large Joint Inj: R knee on 07/18/2019 3:19 PM Indications: pain Details: 22 G 1.5 in needle, superolateral approach  Arthrogram: No  Medications: 40 mg methylPREDNISolone acetate 40 MG/ML; 5 mL lidocaine 1 % Aspirate: 40 mL yellow Outcome: tolerated well, no immediate complications Procedure, treatment alternatives, risks and benefits explained, specific risks discussed. Consent was given by the patient. Immediately prior to procedure a time out was called to verify the correct patient, procedure, equipment, support staff and site/side marked as required. Patient was prepped and draped in the usual sterile fashion.    Plan: She will follow-up with [place] on an as-needed basis.  Knows to wait at least 3 months between injections.  Questions encouraged and answered at length.

## 2019-08-28 ENCOUNTER — Ambulatory Visit (INDEPENDENT_AMBULATORY_CARE_PROVIDER_SITE_OTHER): Payer: Medicare HMO | Admitting: Podiatry

## 2019-08-28 ENCOUNTER — Encounter: Payer: Self-pay | Admitting: Podiatry

## 2019-08-28 ENCOUNTER — Other Ambulatory Visit: Payer: Self-pay

## 2019-08-28 VITALS — Temp 97.1°F

## 2019-08-28 DIAGNOSIS — Q828 Other specified congenital malformations of skin: Secondary | ICD-10-CM

## 2019-08-28 DIAGNOSIS — B351 Tinea unguium: Secondary | ICD-10-CM | POA: Diagnosis not present

## 2019-08-28 DIAGNOSIS — E1140 Type 2 diabetes mellitus with diabetic neuropathy, unspecified: Secondary | ICD-10-CM | POA: Diagnosis not present

## 2019-08-28 DIAGNOSIS — M216X2 Other acquired deformities of left foot: Secondary | ICD-10-CM

## 2019-08-28 NOTE — Progress Notes (Signed)
This patient returns to my office for at risk foot care.  This patient requires this care by a professional since this patient will be at risk due to having type 2 diabetes.  Patient has painful callus left foot.  This patient is unable to cut nails herself since the patient cannot reach her nails.These nails are painful walking and wearing shoes. Patient has painful callus left foot. This patient presents for at risk foot care today.  General Appearance  Alert, conversant and in no acute stress.  Vascular  Dorsalis pedis and posterior tibial  pulses are palpable  bilaterally.  Capillary return is within normal limits  bilaterally. Temperature is within normal limits  bilaterally.  Neurologic  Senn-Weinstein monofilament wire test within normal limits  bilaterally. Muscle power within normal limits bilaterally.  Nails Thick disfigured discolored nails with subungual debris  from hallux to fifth toes bilaterally. No evidence of bacterial infection or drainage bilaterally.  Orthopedic  No limitations of motion  feet .  No crepitus or effusions noted.  No bony pathology or digital deformities noted.  Skin  normotropic skin with no porokeratosis noted bilaterally.  No signs of infections or ulcers noted.   Porokeratosis sub 5th left foot.  Onychomycosis  Pain in right toes  Pain in left toes  Consent was obtained for treatment procedures.   Mechanical debridement of nails 1-5  bilaterally performed with a nail nipper.  Filed with dremel without incident.    Return office visit    10 weeks                 Told patient to return for periodic foot care and evaluation due to potential at risk complications.   Helane Gunther DPM

## 2019-09-04 ENCOUNTER — Other Ambulatory Visit (INDEPENDENT_AMBULATORY_CARE_PROVIDER_SITE_OTHER): Payer: Self-pay | Admitting: Family Medicine

## 2019-10-23 ENCOUNTER — Encounter: Payer: Self-pay | Admitting: Physician Assistant

## 2019-10-23 ENCOUNTER — Other Ambulatory Visit: Payer: Self-pay

## 2019-10-23 ENCOUNTER — Ambulatory Visit (INDEPENDENT_AMBULATORY_CARE_PROVIDER_SITE_OTHER): Payer: Medicare HMO

## 2019-10-23 ENCOUNTER — Ambulatory Visit (INDEPENDENT_AMBULATORY_CARE_PROVIDER_SITE_OTHER): Payer: Medicare HMO | Admitting: Physician Assistant

## 2019-10-23 VITALS — Ht 65.0 in | Wt 213.0 lb

## 2019-10-23 DIAGNOSIS — M25561 Pain in right knee: Secondary | ICD-10-CM

## 2019-10-23 DIAGNOSIS — M1711 Unilateral primary osteoarthritis, right knee: Secondary | ICD-10-CM | POA: Diagnosis not present

## 2019-10-23 DIAGNOSIS — G8929 Other chronic pain: Secondary | ICD-10-CM | POA: Diagnosis not present

## 2019-10-23 MED ORDER — LIDOCAINE HCL 1 % IJ SOLN
5.0000 mL | INTRAMUSCULAR | Status: AC | PRN
  Administered 2019-10-23: 5 mL

## 2019-10-23 MED ORDER — METHYLPREDNISOLONE ACETATE 40 MG/ML IJ SUSP
40.0000 mg | INTRAMUSCULAR | Status: AC | PRN
  Administered 2019-10-23: 40 mg via INTRA_ARTICULAR

## 2019-10-23 NOTE — Progress Notes (Signed)
Office Visit Note   Patient: Sharon Michael           Date of Birth: 11-03-1945           MRN: [id number] Visit Date: 10/23/2019              Requested by: Fleet Contras, MD [address] [place],  [place] [address] PCP: Fleet Contras, MD   Assessment & Plan: Visit Diagnoses:  1. Chronic pain of right knee   2. Primary osteoarthritis of right knee     Plan: She understands she needs to wait at least 3 months between injections in the knee.  She will follow-up with [place] on an as-needed basis.  Questions were encouraged and answered.  Follow-Up Instructions: Return if symptoms worsen or fail to improve.   Orders:  Orders Placed This Encounter  Procedures  . Large Joint Inj: R knee  . XR Knee 1-2 Views Right   No orders of the defined types were placed in this encounter.     Procedures: Large Joint Inj: R knee on 10/23/2019 4:26 PM Indications: pain Details: 22 G 1.5 in needle, anterolateral approach  Arthrogram: No  Medications: 40 mg methylPREDNISolone acetate 40 MG/ML; 5 mL lidocaine 1 % Aspirate: 30 mL yellow Outcome: tolerated well, no immediate complications Procedure, treatment alternatives, risks and benefits explained, specific risks discussed. Consent was given by the patient. Immediately prior to procedure a time out was called to verify the correct patient, procedure, equipment, support staff and site/side marked as required. Patient was prepped and draped in the usual sterile fashion.       Clinical Data: No additional findings.   Subjective: Chief Complaint  Patient presents with  . Right Knee - Pain    HPI Sharon Michael is well-known to Sharon Michael service comes in today due to right knee pain.  She was last seen in early March and had aspiration injection in the knee.  States this helped until recently.  She has had no new injury.  She is wearing the knee brace at times.  She does work in a [employer], sport and exercise on her knee a lot.  She is tried  Voltaren gel.  Currently she does not want to undergo any surgical intervention of her knee and she states she wants to wait until after she is retired.  Review of Systems Negative for fevers or chills.   Objective: Vital Signs: Ht 5\' 5"  (1.651 m)   Wt 213 lb (96.6 kg)   BMI 35.45 kg/m   Physical Exam Constitutional:      Appearance: She is not ill-appearing or diaphoretic.  Pulmonary:     Effort: Pulmonary effort is normal.  Neurological:     Mental Status: She is alert and oriented to person, place, and time.  Psychiatric:        Mood and Affect: Mood normal.     Ortho Exam Right knee tenderness along the lateral joint line.  No instability valgus varus stressing.  Overall good range of motion of the knee.  Positive effusion no abnormal warmth erythema or ecchymosis.  Anterior drawer is negative. Specialty Comments:  No specialty comments available.  Imaging: XR Knee 1-2 Views Right  Result Date: 10/23/2019 Right knee 2 views: End-stage lateral compartmental arthritic changes.  Severe patellofemoral changes.  Mild medial compartmental changes.  No subluxation dislocation.  No acute fractures.    PMFS History: Patient Active Problem List   Diagnosis Date Noted  . Unilateral primary osteoarthritis, right  knee 07/16/2018  . Chronic pain of left knee 02/08/2017  . Unilateral primary osteoarthritis, left knee 02/08/2017  . 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 Diabetes mellitus without complication ([facility])   . Hyperlipidemia   . Hypertension     Family History  Problem Relation Age of Onset  . Kidney disease Mother   . Hypertension Mother   . Diabetes Mother   . Heart disease Father   . Hypertension Father   . Diabetes Father   . Kidney disease Sister   . Cancer Brother     Past Surgical History:  Procedure Laterality Date  . ABDOMINAL HYSTERECTOMY    . BREAST BIOPSY Right    Social History     Occupational History  . Not on file  Tobacco Use  . Smoking status: Never Smoker  . Smokeless tobacco: Never Used  Substance and Sexual Activity  . Alcohol use: No  . Drug use: No  . Sexual activity: Not on file

## 2019-11-06 ENCOUNTER — Ambulatory Visit: Payer: Medicare HMO | Admitting: Podiatry

## 2020-01-28 ENCOUNTER — Other Ambulatory Visit: Payer: Self-pay | Admitting: Internal Medicine

## 2020-01-28 DIAGNOSIS — Z1231 Encounter for screening mammogram for malignant neoplasm of breast: Secondary | ICD-10-CM

## 2020-03-05 ENCOUNTER — Telehealth: Payer: Self-pay | Admitting: Family Medicine

## 2020-03-05 ENCOUNTER — Encounter: Payer: Self-pay | Admitting: Family Medicine

## 2020-03-05 ENCOUNTER — Ambulatory Visit (INDEPENDENT_AMBULATORY_CARE_PROVIDER_SITE_OTHER): Payer: Medicare HMO | Admitting: Family Medicine

## 2020-03-05 ENCOUNTER — Other Ambulatory Visit: Payer: Self-pay

## 2020-03-05 DIAGNOSIS — M1711 Unilateral primary osteoarthritis, right knee: Secondary | ICD-10-CM | POA: Diagnosis not present

## 2020-03-05 NOTE — Patient Instructions (Signed)
   Glucosamine sulfate:  1,000 mg twice daily  Turmeric:  500 mg twice daily   

## 2020-03-05 NOTE — Progress Notes (Signed)
   Office Visit Note   Patient: Sharon Michael           Date of Birth: [birth year]           MRN: [id number] Visit Date: 03/05/2020 Requested by: Fleet Contras, MD [address] [place],  [place] [address] PCP: Fleet Contras, MD  Subjective: Chief Complaint  Patient presents with  . Right Knee - Pain    Right knee painful and swollen - had flared up again last week.    HPI: She is here with recurrent right knee pain.  Injection in June gave her good relief lasting until couple weeks ago.  It became swollen and painful again, mostly on the medial aspect.              ROS: No fevers or chills.  All other systems were reviewed and are negative.  Objective: Vital Signs: There were no vitals taken for this visit.  Physical Exam:  General:  Alert and oriented, in no acute distress. Pulm:  Breathing unlabored. Psy:  Normal mood, congruent affect. Skin: No erythema Right knee: 2+ effusion with no warmth.  No pain with patella compression, trace patellofemoral crepitus.  Full active extension and flexion of 125 degrees.  Tender on the medial joint line, no palpable click with McMurray's.  Slight laxity with valgus stress but still a solid endpoint.  Imaging: No results found.  Assessment & Plan: 1.  Right knee osteoarthritis -Discussed options with her and she wants the knee aspirated and injected today.  She will try glucosamine and turmeric as well.  We will request approval for gel injections for future use.     Procedures: Right knee aspiration and injection: After sterile prep with Betadine, injected 5 cc 1% lidocaine without epinephrine, then aspirated 30 cc of clear yellow synovial fluid with white particulate matter, then injected 6 mg betamethasone from superolateral approach.    PMFS History: Patient Active Problem List   Diagnosis Date Noted  . Unilateral primary osteoarthritis, right knee 07/16/2018  . Chronic pain of left knee 02/08/2017  . Unilateral  primary osteoarthritis, left knee 02/08/2017  . 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 Diabetes mellitus without complication ([facility])   . Hyperlipidemia   . Hypertension     Family History  Problem Relation Age of Onset  . Kidney disease Mother   . Hypertension Mother   . Diabetes Mother   . Heart disease Father   . Hypertension Father   . Diabetes Father   . Kidney disease Sister   . Cancer Brother     Past Surgical History:  Procedure Laterality Date  . ABDOMINAL HYSTERECTOMY    . BREAST BIOPSY Right    Social History   Occupational History  . Not on file  Tobacco Use  . Smoking status: Never Smoker  . Smokeless tobacco: Never Used  Substance and Sexual Activity  . Alcohol use: No  . Drug use: No  . Sexual activity: Not on file

## 2020-03-05 NOTE — Telephone Encounter (Signed)
Requesting approval for gel injections for right knee OA. 

## 2020-03-06 ENCOUNTER — Ambulatory Visit
Admission: RE | Admit: 2020-03-06 | Discharge: 2020-03-06 | Disposition: A | Discharge status: Home / Self Care | Payer: Medicare HMO | Source: Ambulatory Visit | Attending: Internal Medicine | Admitting: Internal Medicine

## 2020-03-06 DIAGNOSIS — Z1231 Encounter for screening mammogram for malignant neoplasm of breast: Secondary | ICD-10-CM

## 2020-03-06 NOTE — Telephone Encounter (Signed)
Noted  

## 2020-03-17 ENCOUNTER — Telehealth: Payer: Self-pay

## 2020-03-17 NOTE — Telephone Encounter (Signed)
Submitted VOB, Monovisc, right knee. 

## 2020-03-27 ENCOUNTER — Telehealth: Payer: Self-pay

## 2020-03-27 NOTE — Telephone Encounter (Signed)
Approved for Monovisc-Right knee Dr. Prince Rome Buy and bill $25 copay 20% OOP Prior auth required with [employer] Auth # [id number] Dates: 03/27/20-09/24/20

## 2020-03-30 NOTE — Telephone Encounter (Signed)
Tried calling pt to schedule but phone kept ringing. If she calls back ok to schedule @ next available

## 2020-03-31 ENCOUNTER — Ambulatory Visit (INDEPENDENT_AMBULATORY_CARE_PROVIDER_SITE_OTHER): Payer: Medicare HMO | Admitting: Family Medicine

## 2020-03-31 ENCOUNTER — Encounter: Payer: Self-pay | Admitting: Family Medicine

## 2020-03-31 ENCOUNTER — Other Ambulatory Visit: Payer: Self-pay

## 2020-03-31 DIAGNOSIS — M1711 Unilateral primary osteoarthritis, right knee: Secondary | ICD-10-CM

## 2020-03-31 NOTE — Progress Notes (Signed)
   Office Visit Note   Patient: Sharon Michael           Date of Birth: 1946/02/24           MRN: [id number] Visit Date: 03/31/2020 Requested by: Fleet Contras, MD [address] [place],  [place] [address] PCP: Fleet Contras, MD  Subjective: Chief Complaint  Patient presents with  . Right Knee - Pain, Follow-up    Monovisc injection    HPI: 74yo F presenting to clinic for monovisc injection and knee aspiration. Patient states that her knee swelled very quickly after her aspiration a few weeks ago, and she's hoping to get more fluid off today. She is otherwise doing well, with no additional concerns.               ROS:   All other systems were reviewed and are negative.  Objective: Vital Signs: There were no vitals taken for this visit.  Physical Exam:  General:  Alert and oriented, in no acute distress. Pulm:  Breathing unlabored. Psy:  Normal mood, congruent affect. Skin:  Right knee overlying skin intact, with no bruising or erythema.   Antalgic gait favoring right knee.  Right knee with moderate effusion. Tenderness to palpation over medial and lateral joint lines.   Imaging: No results found.  Assessment & Plan: 74yo F presenting to clinic requesting knee aspiration and monovisc injection. This is her first viscosupplementation injection. Procedure performed as described below, which patient tolerated well. Return precautions were discussed.      Procedures: Right Knee Aspiration and Monovisc Injection:  Risks and benefits of procedure discussed, Patient opted to proceed. Verbal Consent obtained.  Timeout performed.  Skin prepped in a sterile fashion with betadine before further cleansing with alcohol. Ethyl Chloride was used for topical analgesia.  Right Knee was injected with 5cc 1% Lidocaine without epinephrine via the suprapatellar approach using a 25G, 1.5in needle. 38ml of clear yellow fluid was then drawn off through an 18G needle. Syringe was removed  from this needle, and monovisc viscosupplementation was injected into the joint.  Patient tolerated the injection well with no immediate complications. Aftercare instructions were discussed, and patient was given strict return precautions.     PMFS History: Patient Active Problem List   Diagnosis Date Noted  . Unilateral primary osteoarthritis, right knee 07/16/2018  . Chronic pain of left knee 02/08/2017  . Unilateral primary osteoarthritis, left knee 02/08/2017  . 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 Diabetes mellitus without complication ([facility])   . Hyperlipidemia   . Hypertension     Family History  Problem Relation Age of Onset  . Kidney disease Mother   . Hypertension Mother   . Diabetes Mother   . Heart disease Father   . Hypertension Father   . Diabetes Father   . Kidney disease Sister   . Cancer Brother     Past Surgical History:  Procedure Laterality Date  . ABDOMINAL HYSTERECTOMY    . BREAST BIOPSY Right    Social History   Occupational History  . Not on file  Tobacco Use  . Smoking status: Never Smoker  . Smokeless tobacco: Never Used  Substance and Sexual Activity  . Alcohol use: No  . Drug use: No  . Sexual activity: Not on file

## 2020-03-31 NOTE — Progress Notes (Signed)
I saw and examined the patient with Dr. Marga Hoots and agree with assessment and plan as outlined.    Right knee monovisc injected today after 25 cc aspirated.

## 2020-08-25 ENCOUNTER — Other Ambulatory Visit: Payer: Self-pay

## 2020-08-25 ENCOUNTER — Ambulatory Visit (INDEPENDENT_AMBULATORY_CARE_PROVIDER_SITE_OTHER): Payer: Medicare PPO | Admitting: Family Medicine

## 2020-08-25 DIAGNOSIS — M1711 Unilateral primary osteoarthritis, right knee: Secondary | ICD-10-CM | POA: Diagnosis not present

## 2020-08-25 NOTE — Progress Notes (Signed)
   Office Visit Note   Patient: Sharon Michael           Date of Birth: 10/14/1945           MRN: [id number] Visit Date: 08/25/2020 Requested by: Fleet Contras, MD [address] [place],  [place] [address] PCP: Fleet Contras, MD  Subjective: Chief Complaint  Patient presents with  . Right Knee - Pain    Pain in the lateral aspect of the knee, plus swelling, x 1 month. Last had aspiration + Monovisc injection in November 2021.    HPI: She is here with right knee pain and swelling.  Symptoms progressively worsening for the past month.              ROS:   All other systems were reviewed and are negative.  Objective: Vital Signs: There were no vitals taken for this visit.  Physical Exam:  General:  Alert and oriented, in no acute distress. Pulm:  Breathing unlabored. Psy:  Normal mood, congruent affect. Skin: No erythema Right knee: 2+ effusion with no warmth.  Very tender across the joint line.  Imaging: No results found.  Assessment & Plan: 1.  Right knee DJD with effusion -Discussed with her and elected to aspirate and inject today.  Follow-up as needed.     Procedures: Right knee aspiration and injection: After sterile prep with Betadine, injected 3 cc 0.25% bupivacaine then aspirated 25 cc clear yellow synovial fluid with particulate matter, then injected 40 mg of Depo-Medrol from superolateral approach.       PMFS History: Patient Active Problem List   Diagnosis Date Noted  . Unilateral primary osteoarthritis, right knee 07/16/2018  . Chronic pain of left knee 02/08/2017  . Unilateral primary osteoarthritis, left knee 02/08/2017  . 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 Diabetes mellitus without complication ([facility])   . Hyperlipidemia   . Hypertension     Family History  Problem Relation Age of Onset  . Kidney disease Mother   . Hypertension Mother   . Diabetes Mother   . Heart  disease Father   . Hypertension Father   . Diabetes Father   . Kidney disease Sister   . Cancer Brother     Past Surgical History:  Procedure Laterality Date  . ABDOMINAL HYSTERECTOMY    . BREAST BIOPSY Right    Social History   Occupational History  . Not on file  Tobacco Use  . Smoking status: Never Smoker  . Smokeless tobacco: Never Used  Substance and Sexual Activity  . Alcohol use: No  . Drug use: No  . Sexual activity: Not on file

## 2020-10-30 ENCOUNTER — Ambulatory Visit (INDEPENDENT_AMBULATORY_CARE_PROVIDER_SITE_OTHER): Payer: Medicare PPO | Admitting: Podiatry

## 2020-10-30 ENCOUNTER — Encounter: Payer: Self-pay | Admitting: Podiatry

## 2020-10-30 ENCOUNTER — Other Ambulatory Visit: Payer: Self-pay

## 2020-10-30 DIAGNOSIS — B351 Tinea unguium: Secondary | ICD-10-CM | POA: Diagnosis not present

## 2020-10-30 DIAGNOSIS — M216X2 Other acquired deformities of left foot: Secondary | ICD-10-CM

## 2020-10-30 DIAGNOSIS — E1140 Type 2 diabetes mellitus with diabetic neuropathy, unspecified: Secondary | ICD-10-CM

## 2020-10-30 DIAGNOSIS — Q828 Other specified congenital malformations of skin: Secondary | ICD-10-CM

## 2020-10-30 NOTE — Progress Notes (Signed)
This patient returns to my office for at risk foot care.  This patient requires this care by a professional since this patient will be at risk due to having type 2 diabetes.  Patient has painful callus left foot.  This patient is unable to cut nails herself since the patient cannot reach her nails.These nails are painful walking and wearing shoes. Patient has painful callus left foot. This patient presents for at risk foot care today.  General Appearance  Alert, conversant and in no acute stress.  Vascular  Dorsalis pedis and posterior tibial  pulses are palpable  bilaterally.  Capillary return is within normal limits  bilaterally. Temperature is within normal limits  bilaterally.  Neurologic  Senn-Weinstein monofilament wire test within normal limits  bilaterally. Muscle power within normal limits bilaterally.  Nails Thick disfigured discolored nails with subungual debris  from hallux to fifth toes bilaterally. No evidence of bacterial infection or drainage bilaterally.  Orthopedic  No limitations of motion  feet .  No crepitus or effusions noted.  No bony pathology or digital deformities noted.  Skin  normotropic skin with no porokeratosis noted bilaterally.  No signs of infections or ulcers noted.   Porokeratosis sub 5th left foot.  Onychomycosis  Pain in right toes  Pain in left toes  Consent was obtained for treatment procedures.   Mechanical debridement of nails 1-5  bilaterally performed with a nail nipper.  Filed with dremel without incident.    Return office visit    12 weeks                 Told patient to return for periodic foot care and evaluation due to potential at risk complications.   Mickey Hebel DPM  

## 2020-11-23 ENCOUNTER — Ambulatory Visit (INDEPENDENT_AMBULATORY_CARE_PROVIDER_SITE_OTHER): Payer: Medicare PPO | Admitting: Family Medicine

## 2020-11-23 ENCOUNTER — Other Ambulatory Visit: Payer: Self-pay

## 2020-11-23 ENCOUNTER — Encounter: Payer: Self-pay | Admitting: Family Medicine

## 2020-11-23 VITALS — Ht 62.25 in | Wt 218.2 lb

## 2020-11-23 DIAGNOSIS — M1711 Unilateral primary osteoarthritis, right knee: Secondary | ICD-10-CM

## 2020-11-23 NOTE — Progress Notes (Signed)
   Office Visit Note   Patient: Sharon Michael           Date of Birth: [birth year]           MRN: [id number] Visit Date: 11/23/2020 Requested by: Fleet Contras, MD [address] [place],  [place] [address] PCP: Fleet Contras, MD  Subjective: Chief Complaint  Patient presents with   Right Knee - Pain    Knee filled with fluid again about a month after the last aspiration/injection in April. She is requesting aspiration again today.     HPI: She is here for follow-up right knee pain.  Unfortunately the injections are only lasting a couple of months at best.  She is at a point where she is ready to discuss knee replacement per Dr. Magnus Ivan.                ROS:   All other systems were reviewed and are negative.  Objective: Vital Signs: There were no vitals taken for this visit.  Physical Exam:  General:  Alert and oriented, in no acute distress. Pulm:  Breathing unlabored. Psy:  Normal mood, congruent affect. Skin: No erythema Right knee: 1+ effusion with no warmth.  Full extension, flexion of about 115 degrees.  Imaging: No results found.  Assessment & Plan: Right knee osteoarthritis -Discussed with her and elected to aspirate the knee today, but did not inject cortisone due to potential upcoming surgery.  I will notify Dr. Magnus Ivan that she is ready to proceed with replacement.     Procedures: Right knee aspiration: After sterile prep with Betadine, injected 3 cc 1% lidocaine without epinephrine and aspirated 15 cc clear yellow synovial fluid with white particulate matter.       PMFS History: Patient Active Problem List   Diagnosis Date Noted   Unilateral primary osteoarthritis, right knee 07/16/2018   Chronic pain of left knee 02/08/2017   Unilateral primary osteoarthritis, left knee 02/08/2017   Type 2 diabetes, controlled, with neuropathy (HCC) 03/19/2013   Past Medical History:  Diagnosis Date   Diabetes mellitus without complication (HCC)     Hyperlipidemia    Hypertension     Family History  Problem Relation Age of Onset   Kidney disease Mother    Hypertension Mother    Diabetes Mother    Heart disease Father    Hypertension Father    Diabetes Father    Kidney disease Sister    Cancer Brother     Past Surgical History:  Procedure Laterality Date   ABDOMINAL HYSTERECTOMY     BREAST BIOPSY Right    Social History   Occupational History   Not on file  Tobacco Use   Smoking status: Never   Smokeless tobacco: Never  Substance and Sexual Activity   Alcohol use: No   Drug use: No   Sexual activity: Not on file

## 2021-01-26 ENCOUNTER — Other Ambulatory Visit: Payer: Self-pay | Admitting: Internal Medicine

## 2021-01-26 DIAGNOSIS — Z1231 Encounter for screening mammogram for malignant neoplasm of breast: Secondary | ICD-10-CM

## 2021-02-05 ENCOUNTER — Encounter: Payer: Self-pay | Admitting: Podiatry

## 2021-02-05 ENCOUNTER — Ambulatory Visit (INDEPENDENT_AMBULATORY_CARE_PROVIDER_SITE_OTHER): Payer: Medicare PPO | Admitting: Podiatry

## 2021-02-05 ENCOUNTER — Other Ambulatory Visit: Payer: Self-pay

## 2021-02-05 DIAGNOSIS — E1140 Type 2 diabetes mellitus with diabetic neuropathy, unspecified: Secondary | ICD-10-CM

## 2021-02-05 DIAGNOSIS — L84 Corns and callosities: Secondary | ICD-10-CM

## 2021-02-05 NOTE — Progress Notes (Signed)
Patient presents to the office for nail care.  She is wearing non-removable nail polish.  Aubrie told her we would be unable to trim her nails due to the polish.  She appears fine with not having her nails done but does request that the callus on her big toe be worked on.    General Appearance  Alert, conversant and in no acute stress.  Vascular  Dorsalis pedis and posterior tibial  pulses are palpable  bilaterally.  Capillary return is within normal limits  bilaterally. Temperature is within normal limits  bilaterally.  Neurologic  Senn-Weinstein monofilament wire test within normal limits  bilaterally. Muscle power within normal limits bilaterally.  Nails Thick disfigured discolored nails with subungual debris  from hallux to fifth toes bilaterally. No evidence of bacterial infection or drainage bilaterally.  Orthopedic  No limitations of motion  feet .  No crepitus or effusions noted.  No bony pathology or digital deformities noted.  Skin  normotropic skin with no porokeratosis noted bilaterally.  No signs of infections or ulcers noted.   Pinch callus left foot.  Callus left foot.  Debride callus with # 15 blade and dremel tool.  She says she will make an appointment for nail care once her nail polish is removed.  RTC prn  Helane Gunther DPM

## 2021-03-08 ENCOUNTER — Ambulatory Visit
Admission: RE | Admit: 2021-03-08 | Discharge: 2021-03-08 | Disposition: A | Discharge status: Home / Self Care | Payer: Medicare PPO | Source: Ambulatory Visit | Attending: Internal Medicine | Admitting: Internal Medicine

## 2021-03-08 ENCOUNTER — Other Ambulatory Visit: Payer: Self-pay

## 2021-03-08 DIAGNOSIS — Z1231 Encounter for screening mammogram for malignant neoplasm of breast: Secondary | ICD-10-CM

## 2021-03-12 ENCOUNTER — Other Ambulatory Visit: Payer: Self-pay

## 2021-03-12 ENCOUNTER — Encounter: Payer: Self-pay | Admitting: Podiatry

## 2021-03-12 ENCOUNTER — Ambulatory Visit (INDEPENDENT_AMBULATORY_CARE_PROVIDER_SITE_OTHER): Payer: Medicare PPO | Admitting: Podiatry

## 2021-03-12 DIAGNOSIS — E1140 Type 2 diabetes mellitus with diabetic neuropathy, unspecified: Secondary | ICD-10-CM | POA: Diagnosis not present

## 2021-03-12 DIAGNOSIS — B351 Tinea unguium: Secondary | ICD-10-CM

## 2021-03-12 NOTE — Progress Notes (Signed)
This patient returns to my office for at risk foot care.  This patient requires this care by a professional since this patient will be at risk due to having type 2 diabetes.  Patient has painful callus left foot.  This patient is unable to cut nails herself since the patient cannot reach her nails.These nails are painful walking and wearing shoes. Patient has painful callus left foot. This patient presents for at risk foot care today.  General Appearance  Alert, conversant and in no acute stress.  Vascular  Dorsalis pedis and posterior tibial  pulses are palpable  bilaterally.  Capillary return is within normal limits  bilaterally. Temperature is within normal limits  bilaterally.  Neurologic  Senn-Weinstein monofilament wire test within normal limits  bilaterally. Muscle power within normal limits bilaterally.  Nails Thick disfigured discolored nails with subungual debris  from hallux to fifth toes bilaterally. No evidence of bacterial infection or drainage bilaterally.  Orthopedic  No limitations of motion  feet .  No crepitus or effusions noted.  No bony pathology or digital deformities noted.  Skin  normotropic skin with no porokeratosis noted bilaterally.  No signs of infections or ulcers noted.   Porokeratosis sub 5th left foot.  Onychomycosis  Pain in right toes  Pain in left toes  Consent was obtained for treatment procedures.   Mechanical debridement of nails 1-5  bilaterally performed with a nail nipper.  Filed with dremel without incident.    Return office visit    12 weeks                 Told patient to return for periodic foot care and evaluation due to potential at risk complications.   Haven Pylant DPM  

## 2021-04-23 ENCOUNTER — Telehealth: Payer: Self-pay | Admitting: Orthopaedic Surgery

## 2021-05-19 ENCOUNTER — Other Ambulatory Visit: Payer: Self-pay

## 2021-05-19 ENCOUNTER — Ambulatory Visit (INDEPENDENT_AMBULATORY_CARE_PROVIDER_SITE_OTHER): Payer: Medicare HMO

## 2021-05-19 ENCOUNTER — Ambulatory Visit (INDEPENDENT_AMBULATORY_CARE_PROVIDER_SITE_OTHER): Payer: Medicare HMO | Admitting: Orthopaedic Surgery

## 2021-05-19 VITALS — Ht 62.25 in | Wt 218.0 lb

## 2021-05-19 DIAGNOSIS — M25461 Effusion, right knee: Secondary | ICD-10-CM | POA: Diagnosis not present

## 2021-05-19 DIAGNOSIS — G8929 Other chronic pain: Secondary | ICD-10-CM

## 2021-05-19 DIAGNOSIS — M1711 Unilateral primary osteoarthritis, right knee: Secondary | ICD-10-CM

## 2021-05-19 DIAGNOSIS — M25561 Pain in right knee: Secondary | ICD-10-CM

## 2021-05-19 MED ORDER — METHYLPREDNISOLONE ACETATE 40 MG/ML IJ SUSP
40.0000 mg | INTRAMUSCULAR | Status: AC | PRN
  Administered 2021-05-19: 40 mg via INTRA_ARTICULAR

## 2021-05-19 MED ORDER — LIDOCAINE HCL 1 % IJ SOLN
3.0000 mL | INTRAMUSCULAR | Status: AC | PRN
  Administered 2021-05-19: 3 mL

## 2021-05-19 NOTE — Progress Notes (Signed)
Office Visit Note   Patient: Sharon Michael           Date of Birth: 1945/06/30           MRN: [id number] Visit Date: 05/19/2021              Requested by: Nolene Ebbs, MD [address] [place],  [place] [address] PCP: Nolene Ebbs, MD   Assessment & Plan: Visit Diagnoses:  1. Chronic pain of right knee   2. Effusion, right knee   3. Unilateral primary osteoarthritis, right knee     Plan: The patient understands that she does have severe tricompartment arthritis of her right knee.  I have recommended weight loss and quad strengthening exercises.  I did aspirate about 30 cc of arthritic clear fluid from the knee today and placed a steroid injection in the right knee which she tolerated well.  She will continue to work on weight loss.  I told her that we can always repeat an aspiration and steroid injection in 3 to 4 months if needed or at some time pursue knee replacement surgery when she is comfortable to have this done.  I would still like her to lose some weight.  Follow-Up Instructions: Return if symptoms worsen or fail to improve.   Orders:  Orders Placed This Encounter  Procedures   Large Joint Inj   XR Knee 1-2 Views Right   No orders of the defined types were placed in this encounter.     Procedures: Large Joint Inj on 05/19/2021 10:37 AM Indications: diagnostic evaluation and pain Details: 22 G 1.5 in needle, superolateral approach  Arthrogram: No  Medications: 3 mL lidocaine 1 %; 40 mg methylPREDNISolone acetate 40 MG/ML Outcome: tolerated well, no immediate complications Procedure, treatment alternatives, risks and benefits explained, specific risks discussed. Consent was given by the patient. Immediately prior to procedure a time out was called to verify the correct patient, procedure, equipment, support staff and site/side marked as required. Patient was prepped and draped in the usual sterile fashion.      Clinical Data: No additional  findings.   Subjective: Chief Complaint  Patient presents with   Right Knee - Pain  The patient comes in today with continued chronic right knee pain.  She has had aspirations of that knee in the past.  She ambulates with a cane on occasion.  She has had steroid injections in the past as well.  It does hurt her on a daily basis and is detriment affecting her mobility and her quality of life.  She says she is not ready for knee replacement surgery as of yet.  Its been sometime since she is had even an aspiration or steroid injection for the knee.  She denies any acute change in her medical status but she has gained weight.  Her BMI is now 39.55.  She says that has been from overeating.  HPI  Review of Systems She currently denies any headache, chest pain, shortness of breath, fever, chills, nausea, vomiting  Objective: Vital Signs: Ht 5' 2.25" (1.581 m)    Wt 218 lb (98.9 kg)    BMI 39.55 kg/m   Physical Exam She is alert and orient x3 and in no acute distress Ortho Exam Examination of her right knee does show valgus malalignment.  There is a moderate effusion and patellofemoral crepitation as well throughout the arc of motion. Specialty Comments:  No specialty comments available.  Imaging: XR Knee 1-2 Views Right  Result  Date: 05/19/2021 2 views of the right knee show tricompartment arthritis involving mainly the patellofemoral joint and the lateral compartment with slight valgus malalignment.  There are large osteophytes of the patellofemoral joint.  This is bone-on-bone wear.    PMFS History: Patient Active Problem List   Diagnosis Date Noted   Callus 02/05/2021   Unilateral primary osteoarthritis, right knee 07/16/2018   Chronic pain of left knee 02/08/2017   Unilateral primary osteoarthritis, left knee 02/08/2017   Type 2 diabetes, controlled, with neuropathy ([facility]) 03/19/2013   Past Medical History:  Diagnosis Date   Diabetes mellitus without complication ([facility])     Hyperlipidemia    Hypertension     Family History  Problem Relation Age of Onset   Kidney disease Mother    Hypertension Mother    Diabetes Mother    Heart disease Father    Hypertension Father    Diabetes Father    Kidney disease Sister    Cancer Brother     Past Surgical History:  Procedure Laterality Date   ABDOMINAL HYSTERECTOMY     BREAST BIOPSY Right    Social History   Occupational History   Not on file  Tobacco Use   Smoking status: Never   Smokeless tobacco: Never  Substance and Sexual Activity   Alcohol use: No   Drug use: No   Sexual activity: Not on file

## 2021-06-18 ENCOUNTER — Ambulatory Visit: Payer: Medicare HMO | Admitting: Podiatry

## 2021-06-18 ENCOUNTER — Encounter: Payer: Self-pay | Admitting: Podiatry

## 2021-06-18 ENCOUNTER — Other Ambulatory Visit: Payer: Self-pay

## 2021-06-18 DIAGNOSIS — E1140 Type 2 diabetes mellitus with diabetic neuropathy, unspecified: Secondary | ICD-10-CM | POA: Diagnosis not present

## 2021-06-18 DIAGNOSIS — B351 Tinea unguium: Secondary | ICD-10-CM | POA: Diagnosis not present

## 2021-06-18 NOTE — Progress Notes (Signed)
This patient returns to my office for at risk foot care.  This patient requires this care by a professional since this patient will be at risk due to having type 2 diabetes.  Patient has painful callus left foot.  This patient is unable to cut nails herself since the patient cannot reach her nails.These nails are painful walking and wearing shoes. Patient has painful callus left foot. This patient presents for at risk foot care today.  General Appearance  Alert, conversant and in no acute stress.  Vascular  Dorsalis pedis and posterior tibial  pulses are palpable  bilaterally.  Capillary return is within normal limits  bilaterally. Temperature is within normal limits  bilaterally.  Neurologic  Senn-Weinstein monofilament wire test within normal limits  bilaterally. Muscle power within normal limits bilaterally.  Nails Thick disfigured discolored nails with subungual debris  from hallux to fifth toes bilaterally. No evidence of bacterial infection or drainage bilaterally.  Orthopedic  No limitations of motion  feet .  No crepitus or effusions noted.  No bony pathology or digital deformities noted.  Skin  normotropic skin with no porokeratosis noted bilaterally.  No signs of infections or ulcers noted.   Porokeratosis sub 5th left foot.  Onychomycosis  Pain in right toes  Pain in left toes  Consent was obtained for treatment procedures.   Mechanical debridement of nails 1-5  bilaterally performed with a nail nipper.  Filed with dremel without incident.    Return office visit    12 weeks                 Told patient to return for periodic foot care and evaluation due to potential at risk complications.   Julitza Rickles DPM  

## 2021-09-08 ENCOUNTER — Ambulatory Visit (INDEPENDENT_AMBULATORY_CARE_PROVIDER_SITE_OTHER): Payer: Medicare HMO | Admitting: Orthopaedic Surgery

## 2021-09-08 ENCOUNTER — Encounter: Payer: Self-pay | Admitting: Orthopaedic Surgery

## 2021-09-08 VITALS — Ht 62.5 in | Wt 218.0 lb

## 2021-09-08 DIAGNOSIS — M1711 Unilateral primary osteoarthritis, right knee: Secondary | ICD-10-CM

## 2021-09-08 MED ORDER — LIDOCAINE HCL 1 % IJ SOLN
3.0000 mL | INTRAMUSCULAR | Status: AC | PRN
  Administered 2021-09-08: 3 mL

## 2021-09-08 MED ORDER — METHYLPREDNISOLONE ACETATE 40 MG/ML IJ SUSP
40.0000 mg | INTRAMUSCULAR | Status: AC | PRN
  Administered 2021-09-08: 40 mg via INTRA_ARTICULAR

## 2021-09-08 NOTE — Progress Notes (Signed)
? ?  Procedure Note ? ?Patient: Sharon Michael             ?Date of Birth: 1945/07/05           ?MRN: [id number]             ?Visit Date: 09/08/2021 ?HPI: Sharon Michael returns today due to right knee pain.  She was last seen on 05/19/2021 and given cortisone injection also aspiration of the knee.  He states it did well for at least 3 months just recently started bothering again.  No new injury.  She had no fevers chills.  She is diabetic but reports her diabetes to be under good control with a hemoglobin A1c drawn yesterday.  She reports that her hypertension however is poorly controlled. ? ?Review of systems see HPI otherwise negative ? ?Physical exam: General well-developed well-nourished female in no acute distress ambulates without any assistive device. ?Right knee crepitus with passive range of motion.  She has full extension full flexion.  No instability valgus varus stressing of the knee.  Global tenderness.  Plus minus effusion.  No abnormal warmth erythema.  Valgus malalignment. ? ?Procedures: ?Visit Diagnoses:  ?1. Unilateral primary osteoarthritis, right knee   ? ? ?Large Joint Inj on 09/08/2021 10:52 AM ?Indications: pain ?Details: 22 G 1.5 in needle, anterolateral approach ? ?Arthrogram: No ? ?Medications: 3 mL lidocaine 1 %; 40 mg methylPREDNISolone acetate 40 MG/ML ?Aspirate: 1 mL yellow and blood-tinged ?Outcome: tolerated well, no immediate complications ?Procedure, treatment alternatives, risks and benefits explained, specific risks discussed. Consent was given by the patient. Immediately prior to procedure a time out was called to verify the correct patient, procedure, equipment, support staff and site/side marked as required. Patient was prepped and draped in the usual sterile fashion.  ? ? ?Plan: Per her request did place a steroid injection in the knee.  She does wait least 3 months between injections.  She will follow-up with [place] as needed.  She understands she needs to wait least 2 months  between cortisone injections or any surgery.  She will also need better control of hypertension.  Next office visit would like height and weight. ? ? ?

## 2021-09-17 ENCOUNTER — Encounter: Payer: Self-pay | Admitting: Podiatry

## 2021-09-17 ENCOUNTER — Ambulatory Visit: Payer: Medicare HMO | Admitting: Podiatry

## 2021-09-17 DIAGNOSIS — B351 Tinea unguium: Secondary | ICD-10-CM | POA: Diagnosis not present

## 2021-09-17 DIAGNOSIS — E1140 Type 2 diabetes mellitus with diabetic neuropathy, unspecified: Secondary | ICD-10-CM | POA: Diagnosis not present

## 2021-09-17 NOTE — Progress Notes (Signed)
This patient returns to my office for at risk foot care.  This patient requires this care by a professional since this patient will be at risk due to having type 2 diabetes.  Patient has painful callus left foot.  This patient is unable to cut nails herself since the patient cannot reach her nails.These nails are painful walking and wearing shoes. Patient has painful callus left foot. This patient presents for at risk foot care today.  General Appearance  Alert, conversant and in no acute stress.  Vascular  Dorsalis pedis and posterior tibial  pulses are palpable  bilaterally.  Capillary return is within normal limits  bilaterally. Temperature is within normal limits  bilaterally.  Neurologic  Senn-Weinstein monofilament wire test within normal limits  bilaterally. Muscle power within normal limits bilaterally.  Nails Thick disfigured discolored nails with subungual debris  from hallux to fifth toes bilaterally. No evidence of bacterial infection or drainage bilaterally.  Orthopedic  No limitations of motion  feet .  No crepitus or effusions noted.  No bony pathology or digital deformities noted.  Skin  normotropic skin with no porokeratosis noted bilaterally.  No signs of infections or ulcers noted.   Porokeratosis sub 5th left foot.  Onychomycosis  Pain in right toes  Pain in left toes  Consent was obtained for treatment procedures.   Mechanical debridement of nails 1-5  bilaterally performed with a nail nipper.  Filed with dremel without incident.    Return office visit    12 weeks                 Told patient to return for periodic foot care and evaluation due to potential at risk complications.   Kayceon Oki DPM  

## 2021-10-21 DIAGNOSIS — E7849 Other hyperlipidemia: Secondary | ICD-10-CM | POA: Diagnosis not present

## 2021-10-21 DIAGNOSIS — I10 Essential (primary) hypertension: Secondary | ICD-10-CM | POA: Diagnosis not present

## 2021-10-21 DIAGNOSIS — M179 Osteoarthritis of knee, unspecified: Secondary | ICD-10-CM | POA: Diagnosis not present

## 2021-10-21 DIAGNOSIS — E119 Type 2 diabetes mellitus without complications: Secondary | ICD-10-CM | POA: Diagnosis not present

## 2021-10-28 DIAGNOSIS — H43813 Vitreous degeneration, bilateral: Secondary | ICD-10-CM | POA: Diagnosis not present

## 2021-10-28 DIAGNOSIS — H401131 Primary open-angle glaucoma, bilateral, mild stage: Secondary | ICD-10-CM | POA: Diagnosis not present

## 2021-12-02 DIAGNOSIS — H401131 Primary open-angle glaucoma, bilateral, mild stage: Secondary | ICD-10-CM | POA: Diagnosis not present

## 2021-12-02 DIAGNOSIS — H43813 Vitreous degeneration, bilateral: Secondary | ICD-10-CM | POA: Diagnosis not present

## 2021-12-08 ENCOUNTER — Ambulatory Visit: Payer: Medicare HMO | Admitting: Orthopaedic Surgery

## 2021-12-24 ENCOUNTER — Ambulatory Visit (INDEPENDENT_AMBULATORY_CARE_PROVIDER_SITE_OTHER): Payer: Medicare HMO | Admitting: Podiatry

## 2021-12-24 ENCOUNTER — Encounter: Payer: Self-pay | Admitting: Podiatry

## 2021-12-24 DIAGNOSIS — E1140 Type 2 diabetes mellitus with diabetic neuropathy, unspecified: Secondary | ICD-10-CM

## 2021-12-24 DIAGNOSIS — B351 Tinea unguium: Secondary | ICD-10-CM

## 2021-12-24 NOTE — Progress Notes (Signed)
This patient returns to my office for at risk foot care.  This patient requires this care by a professional since this patient will be at risk due to having type 2 diabetes.  Patient has painful callus left foot.  This patient is unable to cut nails herself since the patient cannot reach her nails.These nails are painful walking and wearing shoes. Patient has painful callus left foot. This patient presents for at risk foot care today.  General Appearance  Alert, conversant and in no acute stress.  Vascular  Dorsalis pedis and posterior tibial  pulses are palpable  bilaterally.  Capillary return is within normal limits  bilaterally. Temperature is within normal limits  bilaterally.  Neurologic  Senn-Weinstein monofilament wire test within normal limits  bilaterally. Muscle power within normal limits bilaterally.  Nails Thick disfigured discolored nails with subungual debris  from hallux to fifth toes bilaterally. No evidence of bacterial infection or drainage bilaterally.  Orthopedic  No limitations of motion  feet .  No crepitus or effusions noted.  No bony pathology or digital deformities noted.  Skin  normotropic skin with no porokeratosis noted bilaterally.  No signs of infections or ulcers noted.   Porokeratosis sub 5th left foot.  Onychomycosis  Pain in right toes  Pain in left toes  Consent was obtained for treatment procedures.   Mechanical debridement of nails 1-5  bilaterally performed with a nail nipper.  Filed with dremel without incident.    Return office visit    12 weeks                 Told patient to return for periodic foot care and evaluation due to potential at risk complications.   Bexley Mclester DPM  

## 2022-01-03 ENCOUNTER — Encounter: Payer: Self-pay | Admitting: Orthopaedic Surgery

## 2022-01-03 ENCOUNTER — Ambulatory Visit: Payer: Medicare HMO | Admitting: Orthopaedic Surgery

## 2022-01-03 VITALS — Ht 63.0 in | Wt 220.0 lb

## 2022-01-03 DIAGNOSIS — M1711 Unilateral primary osteoarthritis, right knee: Secondary | ICD-10-CM

## 2022-01-03 DIAGNOSIS — M25561 Pain in right knee: Secondary | ICD-10-CM | POA: Diagnosis not present

## 2022-01-03 DIAGNOSIS — G8929 Other chronic pain: Secondary | ICD-10-CM | POA: Diagnosis not present

## 2022-01-03 MED ORDER — METHYLPREDNISOLONE ACETATE 40 MG/ML IJ SUSP
40.0000 mg | INTRAMUSCULAR | Status: AC | PRN
  Administered 2022-01-03: 40 mg via INTRA_ARTICULAR

## 2022-01-03 MED ORDER — LIDOCAINE HCL 1 % IJ SOLN
3.0000 mL | INTRAMUSCULAR | Status: AC | PRN
  Administered 2022-01-03: 3 mL

## 2022-01-03 NOTE — Progress Notes (Signed)
Office Visit Note   Patient: Sharon Michael           Date of Birth: Jul 30, 1945           MRN: [id number] Visit Date: 01/03/2022              Requested by: Fleet Contras, MD [address] [place],  [place] [address] PCP: Fleet Contras, MD   Assessment & Plan: Visit Diagnoses:  1. Unilateral primary osteoarthritis, right knee   2. Chronic pain of right knee     Plan: I talked her in length in detail again about knee replacement surgery but she defers this.  I did place a steroid injection in her right knee today which she tolerated well.  Follow-up is as needed.  If she does come in again at some point talking about the possibility of knee replacement surgery we will need to repeat AP and lateral of the right knee.  Follow-Up Instructions: Return if symptoms worsen or fail to improve.   Orders:  Orders Placed This Encounter  Procedures   Large Joint Inj   No orders of the defined types were placed in this encounter.     Procedures: Large Joint Inj: R knee on 01/03/2022 11:21 AM Indications: diagnostic evaluation and pain Details: 22 G 1.5 in needle, superolateral approach  Arthrogram: No  Medications: 3 mL lidocaine 1 %; 40 mg methylPREDNISolone acetate 40 MG/ML Outcome: tolerated well, no immediate complications Procedure, treatment alternatives, risks and benefits explained, specific risks discussed. Consent was given by the patient. Immediately prior to procedure a time out was called to verify the correct patient, procedure, equipment, support staff and site/side marked as required. Patient was prepped and draped in the usual sterile fashion.       Clinical Data: No additional findings.   Subjective: Chief Complaint  Patient presents with   Right Knee - Follow-up  The patient is well-known to [place].  She has documented tricompartment arthritis of her right knee.  She is 76.  We last injected her knee 4 months ago.  I talked her today about knee replacement  surgery but she still would rather continue her current treatment modalities.  She has had no acute change in her medical status.  She had a friend recently had a knee replacement who she said has not done well.  HPI  Review of Systems There is no listed fever or chills  Objective: Vital Signs: Ht 5\' 3"  (1.6 m)   Wt 220 lb (99.8 kg)   BMI 38.97 kg/m   Physical Exam She is alert and orient x3 and in no acute distress Ortho Exam Examination of her right knee shows valgus malalignment with significant lateral joint line tenderness as well as patellofemoral crepitation.  There is pain throughout the arc of motion of her knee. Specialty Comments:  No specialty comments available.  Imaging: No results found. Previous x-rays of her knee in 2021 showed right knee tricompartment arthritis with valgus malalignment.  PMFS History: Patient Active Problem List   Diagnosis Date Noted   Callus 02/05/2021   Unilateral primary osteoarthritis, right knee 07/16/2018   Chronic pain of left knee 02/08/2017   Unilateral primary osteoarthritis, left knee 02/08/2017   Type 2 diabetes, controlled, with neuropathy (HCC) 03/19/2013   Past Medical History:  Diagnosis Date   Diabetes mellitus without complication (HCC)    Hyperlipidemia    Hypertension     Family History  Problem Relation Age of Onset   Kidney  disease Mother    Hypertension Mother    Diabetes Mother    Heart disease Father    Hypertension Father    Diabetes Father    Kidney disease Sister    Cancer Brother     Past Surgical History:  Procedure Laterality Date   ABDOMINAL HYSTERECTOMY     BREAST BIOPSY Right    Social History   Occupational History   Not on file  Tobacco Use   Smoking status: Never   Smokeless tobacco: Never  Substance and Sexual Activity   Alcohol use: No   Drug use: No   Sexual activity: Not on file

## 2022-01-25 DIAGNOSIS — E119 Type 2 diabetes mellitus without complications: Secondary | ICD-10-CM | POA: Diagnosis not present

## 2022-01-25 DIAGNOSIS — I10 Essential (primary) hypertension: Secondary | ICD-10-CM | POA: Diagnosis not present

## 2022-01-25 DIAGNOSIS — E7849 Other hyperlipidemia: Secondary | ICD-10-CM | POA: Diagnosis not present

## 2022-01-25 DIAGNOSIS — Z23 Encounter for immunization: Secondary | ICD-10-CM | POA: Diagnosis not present

## 2022-01-25 DIAGNOSIS — M179 Osteoarthritis of knee, unspecified: Secondary | ICD-10-CM | POA: Diagnosis not present

## 2022-02-24 ENCOUNTER — Other Ambulatory Visit: Payer: Self-pay | Admitting: Internal Medicine

## 2022-02-24 DIAGNOSIS — Z1231 Encounter for screening mammogram for malignant neoplasm of breast: Secondary | ICD-10-CM

## 2022-03-10 DIAGNOSIS — H401131 Primary open-angle glaucoma, bilateral, mild stage: Secondary | ICD-10-CM | POA: Diagnosis not present

## 2022-03-10 DIAGNOSIS — H43813 Vitreous degeneration, bilateral: Secondary | ICD-10-CM | POA: Diagnosis not present

## 2022-04-01 ENCOUNTER — Encounter: Payer: Self-pay | Admitting: Podiatry

## 2022-04-01 ENCOUNTER — Ambulatory Visit (INDEPENDENT_AMBULATORY_CARE_PROVIDER_SITE_OTHER): Payer: Medicare HMO | Admitting: Podiatry

## 2022-04-01 DIAGNOSIS — E1140 Type 2 diabetes mellitus with diabetic neuropathy, unspecified: Secondary | ICD-10-CM

## 2022-04-01 DIAGNOSIS — B351 Tinea unguium: Secondary | ICD-10-CM | POA: Diagnosis not present

## 2022-04-01 NOTE — Progress Notes (Signed)
This patient returns to my office for at risk foot care.  This patient requires this care by a professional since this patient will be at risk due to having type 2 diabetes.  Patient has painful callus left foot.  This patient is unable to cut nails herself since the patient cannot reach her nails.These nails are painful walking and wearing shoes. Patient has painful callus left foot. This patient presents for at risk foot care today.  General Appearance  Alert, conversant and in no acute stress.  Vascular  Dorsalis pedis and posterior tibial  pulses are palpable  bilaterally.  Capillary return is within normal limits  bilaterally. Temperature is within normal limits  bilaterally.  Neurologic  Senn-Weinstein monofilament wire test within normal limits  bilaterally. Muscle power within normal limits bilaterally.  Nails Thick disfigured discolored nails with subungual debris  from hallux to fifth toes bilaterally. No evidence of bacterial infection or drainage bilaterally.  Orthopedic  No limitations of motion  feet .  No crepitus or effusions noted.  No bony pathology or digital deformities noted.  Skin  normotropic skin with no porokeratosis noted bilaterally.  No signs of infections or ulcers noted.   Porokeratosis sub 5th left foot.  Onychomycosis  Pain in right toes  Pain in left toes  Consent was obtained for treatment procedures.   Mechanical debridement of nails 1-5  bilaterally performed with a nail nipper.  Filed with dremel without incident.    Return office visit    12 weeks                 Told patient to return for periodic foot care and evaluation due to potential at risk complications.   Helane Gunther DPM

## 2022-04-05 ENCOUNTER — Ambulatory Visit: Payer: Medicare HMO

## 2022-04-06 ENCOUNTER — Ambulatory Visit
Admission: RE | Admit: 2022-04-06 | Discharge: 2022-04-06 | Disposition: A | Discharge status: Home / Self Care | Payer: Medicare HMO | Source: Ambulatory Visit | Attending: Internal Medicine | Admitting: Internal Medicine

## 2022-04-06 DIAGNOSIS — Z1231 Encounter for screening mammogram for malignant neoplasm of breast: Secondary | ICD-10-CM

## 2022-04-14 ENCOUNTER — Ambulatory Visit (INDEPENDENT_AMBULATORY_CARE_PROVIDER_SITE_OTHER): Payer: Medicare HMO | Admitting: Podiatry

## 2022-04-14 DIAGNOSIS — I739 Peripheral vascular disease, unspecified: Secondary | ICD-10-CM | POA: Diagnosis not present

## 2022-04-14 DIAGNOSIS — E1142 Type 2 diabetes mellitus with diabetic polyneuropathy: Secondary | ICD-10-CM | POA: Diagnosis not present

## 2022-04-16 NOTE — Progress Notes (Signed)
  Subjective:  Patient ID: Sharon Michael, female    DOB: 1945-10-14,  MRN: [id number]  Chief Complaint  Patient presents with   Nail Problem    Patient is here for left foot 2nd toe nail turning black no pain.    76 y.o. female presents with concern for discoloration and darkening of the left second toe.  She does not have pain associated with this.  She also notices some discoloration of the nails as well.  She says that this toe has begun to turn darker over the course of the past few weeks.   Past Medical History:  Diagnosis Date   Diabetes mellitus without complication (HCC)    Hyperlipidemia    Hypertension     Allergies  Allergen Reactions   Other     ROS: Negative except as per HPI above  Objective:  General: AAO x3, NAD  Dermatological: Dark discoloration of the left second toe no frank gangrenous changes noted there is capillary refill intact to the second toe and it is not overtly cold to touch.  Vascular:  Dorsalis Pedis artery and Posterior Tibial artery pedal pulses are 1/4 bilateral.  Capillary fill time < 3 sec to all digits.   Neruologic: Grossly intact via light touch bilateral. Protective threshold intact to all sites bilateral.   Musculoskeletal: No gross boney pedal deformities bilateral. No pain, crepitus, or limitation noted with foot and ankle range of motion bilateral. Muscular strength 5/5 in all groups tested bilateral.  Gait: Unassisted, Nonantalgic.       1. PVD (peripheral vascular disease) (HCC)   2. DM type 2 with diabetic peripheral neuropathy (HCC)      Plan:  Patient was evaluated and treated and all questions answered.  #Peripheral vascular disease of the left foot possible early ischemic changes of the second toe -Recommend we proceed with vascular testing as the patient does appear to have some darkening discoloration of the second toe which could represent either microvascular disease or an occlusion further approximately  however she does have palpable pulses in the foot so I suspect more of a microvascular process. -Patient will be scheduled for ABI PVR testing and will follow-up with me afterwards to discuss the results.  We will hold off on vascular consultation until after we have the results of the ABI PVR. -Recommend continued monitoring of the discoloration and if it worsens I recommend she call and come in sooner or go to the emergency department for further evaluation.  Return in about 4 weeks (around 05/12/2022) for follow up L 2nd toe discoloration.          Corinna Gab, DPM [facility] / [facility]

## 2022-04-18 ENCOUNTER — Ambulatory Visit (HOSPITAL_COMMUNITY)
Admission: RE | Admit: 2022-04-18 | Discharge: 2022-04-18 | Disposition: A | Discharge status: Home / Self Care | Payer: Medicare HMO | Source: Ambulatory Visit | Attending: Podiatry | Admitting: Podiatry

## 2022-04-18 DIAGNOSIS — E1142 Type 2 diabetes mellitus with diabetic polyneuropathy: Secondary | ICD-10-CM | POA: Diagnosis not present

## 2022-04-18 DIAGNOSIS — I739 Peripheral vascular disease, unspecified: Secondary | ICD-10-CM | POA: Insufficient documentation

## 2022-04-26 DIAGNOSIS — M179 Osteoarthritis of knee, unspecified: Secondary | ICD-10-CM | POA: Diagnosis not present

## 2022-04-26 DIAGNOSIS — E119 Type 2 diabetes mellitus without complications: Secondary | ICD-10-CM | POA: Diagnosis not present

## 2022-04-26 DIAGNOSIS — L84 Corns and callosities: Secondary | ICD-10-CM | POA: Diagnosis not present

## 2022-04-26 DIAGNOSIS — J302 Other seasonal allergic rhinitis: Secondary | ICD-10-CM | POA: Diagnosis not present

## 2022-04-26 DIAGNOSIS — I10 Essential (primary) hypertension: Secondary | ICD-10-CM | POA: Diagnosis not present

## 2022-05-02 ENCOUNTER — Ambulatory Visit: Payer: Medicare HMO | Admitting: Physician Assistant

## 2022-05-12 ENCOUNTER — Ambulatory Visit (INDEPENDENT_AMBULATORY_CARE_PROVIDER_SITE_OTHER): Payer: Medicare HMO | Admitting: Podiatry

## 2022-05-12 ENCOUNTER — Ambulatory Visit: Payer: Medicare HMO | Admitting: Podiatry

## 2022-05-12 DIAGNOSIS — E1140 Type 2 diabetes mellitus with diabetic neuropathy, unspecified: Secondary | ICD-10-CM | POA: Diagnosis not present

## 2022-05-12 DIAGNOSIS — B351 Tinea unguium: Secondary | ICD-10-CM

## 2022-05-12 DIAGNOSIS — I739 Peripheral vascular disease, unspecified: Secondary | ICD-10-CM

## 2022-05-12 NOTE — Progress Notes (Signed)
  Subjective:  Patient ID: Sharon Michael, female    DOB: December 27, 1945,  MRN: [id number]  Chief Complaint  Patient presents with   Follow-up    4 week follow up left 2nd toe discoloration.    76 y.o. female presents for follow-up of the second toe discoloration.  She states that the left second toe is improving in coloration and no longer has any pain much improved from prior.  She had vascular testing done including ABI and PVR which was normal in terms of TBI and ABI of bilateral lower extremity.  No concerns at this time.  Past Medical History:  Diagnosis Date   Diabetes mellitus without complication (HCC)    Hyperlipidemia    Hypertension     Allergies  Allergen Reactions   Other     ROS: Negative except as per HPI above  Objective:  General: AAO x3, NAD  Dermatological: Decreased dark discoloration of left second toe which is improved in color at this time.  Vascular:  Dorsalis Pedis artery and Posterior Tibial artery pedal pulses are 1/4 bilateral.  Capillary fill time < 3 sec to all digits.   Neruologic: Grossly intact via light touch bilateral. Protective threshold intact to all sites bilateral.   Musculoskeletal: No gross boney pedal deformities bilateral. No pain, crepitus, or limitation noted with foot and ankle range of motion bilateral. Muscular strength 5/5 in all groups tested bilateral.  Gait: Unassisted, Nonantalgic.       1. PVD (peripheral vascular disease) (HCC)   2. Onychomycosis   3. Type 2 diabetes, controlled, with neuropathy (HCC)       Plan:  Patient was evaluated and treated and all questions answered.  #Peripheral vascular disease of the left foot possible early ischemic changes of the second toe -Improved at this time. -Discussed results of ABI PVR testing with the patient in detail.  The results are normal. -Do not need to proceed with vascular referral at this time as the patient has improved clinically and the ABI PVR testing is  normal. -To monitor for pain discoloration or wound developing on the left second toe  Return if symptoms worsen or fail to improve.          Corinna Gab, DPM [facility] / [facility]

## 2022-05-15 DIAGNOSIS — E7849 Other hyperlipidemia: Secondary | ICD-10-CM | POA: Diagnosis not present

## 2022-05-15 DIAGNOSIS — E119 Type 2 diabetes mellitus without complications: Secondary | ICD-10-CM | POA: Diagnosis not present

## 2022-05-15 DIAGNOSIS — I10 Essential (primary) hypertension: Secondary | ICD-10-CM | POA: Diagnosis not present

## 2022-06-06 ENCOUNTER — Ambulatory Visit: Payer: Medicare HMO | Admitting: Physician Assistant

## 2022-06-06 ENCOUNTER — Encounter: Payer: Self-pay | Admitting: Physician Assistant

## 2022-06-06 DIAGNOSIS — M1711 Unilateral primary osteoarthritis, right knee: Secondary | ICD-10-CM | POA: Diagnosis not present

## 2022-06-06 MED ORDER — METHYLPREDNISOLONE ACETATE 40 MG/ML IJ SUSP
40.0000 mg | INTRAMUSCULAR | Status: AC | PRN
  Administered 2022-06-06: 40 mg via INTRA_ARTICULAR

## 2022-06-06 MED ORDER — LIDOCAINE HCL 1 % IJ SOLN
4.0000 mL | INTRAMUSCULAR | Status: AC | PRN
  Administered 2022-06-06: 4 mL

## 2022-06-06 NOTE — Progress Notes (Signed)
   Procedure Note  Patient: Sharon Michael             Date of Birth: 1946/02/12           MRN: [id number]             Visit Date: 06/06/2022 HPI: [name] comes in today wanting right knee aspirated and injected.  She was last seen on 01/03/2022 and underwent injection that helped in till about a month ago.  Has had no new injury.  No fevers chills.  She has known tricompartmental arthritis of the right knee.  Review of systems: Negative for fevers chills.  Physical exam: Right knee good range of motion.  No abnormal warmth erythema.  Positive effusion.  Valgus malalignment.  Procedures: Visit Diagnoses:  1. Unilateral primary osteoarthritis, right knee     Large Joint Inj: R knee on 06/06/2022 10:10 AM Indications: pain Details: 22 G 1.5 in needle, superolateral approach  Arthrogram: No  Medications: 4 mL lidocaine 1 %; 40 mg methylPREDNISolone acetate 40 MG/ML Aspirate: 19 mL yellow Outcome: tolerated well, no immediate complications Procedure, treatment alternatives, risks and benefits explained, specific risks discussed. Consent was given by the patient. Immediately prior to procedure a time out was called to verify the correct patient, procedure, equipment, support staff and site/side marked as required. Patient was prepped and draped in the usual sterile fashion.      Plan: She is still thinking about undergoing total knee replacement.  She will let us know if she decides to proceed with knee replacement otherwise follow-up as needed.  Ace bandage was applied today she will remove this before returning to bed this evening.  Questions were encouraged and answered.

## 2022-06-09 DIAGNOSIS — H43813 Vitreous degeneration, bilateral: Secondary | ICD-10-CM | POA: Diagnosis not present

## 2022-06-09 DIAGNOSIS — H401131 Primary open-angle glaucoma, bilateral, mild stage: Secondary | ICD-10-CM | POA: Diagnosis not present

## 2022-07-04 ENCOUNTER — Encounter: Payer: Self-pay | Admitting: Podiatry

## 2022-07-04 ENCOUNTER — Ambulatory Visit: Payer: Medicare HMO | Admitting: Podiatry

## 2022-07-04 DIAGNOSIS — B351 Tinea unguium: Secondary | ICD-10-CM | POA: Diagnosis not present

## 2022-07-04 DIAGNOSIS — E1140 Type 2 diabetes mellitus with diabetic neuropathy, unspecified: Secondary | ICD-10-CM | POA: Diagnosis not present

## 2022-07-04 NOTE — Progress Notes (Signed)
This patient returns to my office for at risk foot care.  This patient requires this care by a professional since this patient will be at risk due to having type 2 diabetes.  Patient has painful callus left foot.  This patient is unable to cut nails herself since the patient cannot reach her nails.These nails are painful walking and wearing shoes. Patient has painful callus left foot. This patient presents for at risk foot care today.  General Appearance  Alert, conversant and in no acute stress.  Vascular  Dorsalis pedis and posterior tibial  pulses are palpable  bilaterally.  Capillary return is within normal limits  bilaterally. Temperature is within normal limits  bilaterally.  Neurologic  Senn-Weinstein monofilament wire test within normal limits  bilaterally. Muscle power within normal limits bilaterally.  Nails Thick disfigured discolored nails with subungual debris  from hallux to fifth toes bilaterally. No evidence of bacterial infection or drainage bilaterally.  Orthopedic  No limitations of motion  feet .  No crepitus or effusions noted.  No bony pathology or digital deformities noted.  Skin  normotropic skin with no porokeratosis noted bilaterally.  No signs of infections or ulcers noted.     Onychomycosis  Pain in right toes  Pain in left toes  Consent was obtained for treatment procedures.   Mechanical debridement of nails 1-5  bilaterally performed with a nail nipper.  Filed with dremel without incident.    Return office visit    12 weeks                 Told patient to return for periodic foot care and evaluation due to potential at risk complications.   Gardiner Barefoot DPM

## 2022-08-02 ENCOUNTER — Other Ambulatory Visit: Payer: Self-pay | Admitting: Internal Medicine

## 2022-08-03 LAB — CBC
HCT: 39.1 % (ref 35.0–45.0)
Hemoglobin: 13.1 g/dL (ref 11.7–15.5)
MCH: 29.6 pg (ref 27.0–33.0)
MCHC: 33.5 g/dL (ref 32.0–36.0)
MCV: 88.3 fL (ref 80.0–100.0)
MPV: 11.6 fL (ref 7.5–12.5)
Platelets: 189 10*3/uL (ref 140–400)
RBC: 4.43 10*6/uL (ref 3.80–5.10)
RDW: 13.8 % (ref 11.0–15.0)
WBC: 4.7 10*3/uL (ref 3.8–10.8)

## 2022-08-03 LAB — COMPLETE METABOLIC PANEL WITH GFR
AG Ratio: 1.6 (calc) (ref 1.0–2.5)
ALT: 25 U/L (ref 6–29)
AST: 28 U/L (ref 10–35)
Albumin: 4.3 g/dL (ref 3.6–5.1)
Alkaline phosphatase (APISO): 74 U/L (ref 37–153)
BUN/Creatinine Ratio: 19 (calc) (ref 6–22)
BUN: 27 mg/dL — ABNORMAL HIGH (ref 7–25)
CO2: 21 mmol/L (ref 20–32)
Calcium: 9.9 mg/dL (ref 8.6–10.4)
Chloride: 104 mmol/L (ref 98–110)
Creat: 1.43 mg/dL — ABNORMAL HIGH (ref 0.60–1.00)
Globulin: 2.7 g/dL (calc) (ref 1.9–3.7)
Glucose, Bld: 93 mg/dL (ref 65–99)
Potassium: 4.2 mmol/L (ref 3.5–5.3)
Sodium: 143 mmol/L (ref 135–146)
Total Bilirubin: 0.5 mg/dL (ref 0.2–1.2)
Total Protein: 7 g/dL (ref 6.1–8.1)
eGFR: 38 mL/min/{1.73_m2} — ABNORMAL LOW (ref >=60)

## 2022-08-03 LAB — HEMOGLOBIN A1C W/OUT EAG: Hgb A1c MFr Bld: 6.1 % of total Hgb — ABNORMAL HIGH (ref <5.7)

## 2022-08-03 LAB — TSH: TSH: 1.99 mIU/L (ref 0.40–4.50)

## 2022-08-03 LAB — LIPID PANEL
Cholesterol: 153 mg/dL (ref <200)
HDL: 63 mg/dL (ref >=50)
LDL Cholesterol (Calc): 76 mg/dL (calc)
Non-HDL Cholesterol (Calc): 90 mg/dL (calc) (ref <130)
Total CHOL/HDL Ratio: 2.4 (calc) (ref <5.0)
Triglycerides: 68 mg/dL (ref <150)

## 2022-08-03 LAB — URIC ACID: Uric Acid, Serum: 10.4 mg/dL — ABNORMAL HIGH (ref 2.5–7.0)

## 2022-09-22 ENCOUNTER — Ambulatory Visit: Payer: Medicare HMO | Admitting: Physician Assistant

## 2022-09-22 ENCOUNTER — Encounter: Payer: Self-pay | Admitting: Physician Assistant

## 2022-09-22 DIAGNOSIS — M1711 Unilateral primary osteoarthritis, right knee: Secondary | ICD-10-CM

## 2022-09-22 MED ORDER — METHYLPREDNISOLONE ACETATE 40 MG/ML IJ SUSP
40.0000 mg | INTRAMUSCULAR | Status: AC | PRN
  Administered 2022-09-22: 40 mg via INTRA_ARTICULAR

## 2022-09-22 MED ORDER — LIDOCAINE HCL 1 % IJ SOLN
3.0000 mL | INTRAMUSCULAR | Status: AC | PRN
  Administered 2022-09-22: 3 mL

## 2022-09-22 NOTE — Progress Notes (Signed)
   Procedure Note  Patient: Sharon Michael             Date of Birth: [birth year]           MRN: [id number]             Visit Date: 09/22/2022 HPI: Mrs. Wolfarth returns today for right knee pain.  She has known osteoarthritis of the right knee.  Denies any new injury.  She is asking for aspiration injection of the knee.  States the last injection which was 06/06/2022 did well until just recently.  She is nondiabetic.  Denies any fevers chills.  She does use a cane to ambulate.  Review of systems see HPI otherwise negative  Physical exam: General well-developed well-nourished female uses cane and left hand ambulates with antalgic gait. Right knee: Full range of motion of the right knee.  No abnormal warmth or erythema.  Positive effusion.  Valgus deformity.  Full extension and flexion.  Tenderness along the lateral joint. Procedures: Visit Diagnoses:  1. Unilateral primary osteoarthritis, right knee     Large Joint Inj on 09/22/2022 1:13 PM Indications: pain Details: 22 G 1.5 in needle, anterolateral approach  Arthrogram: No  Medications: 3 mL lidocaine 1 %; 40 mg methylPREDNISolone acetate 40 MG/ML Aspirate: 24 mL yellow Outcome: tolerated well, no immediate complications Procedure, treatment alternatives, risks and benefits explained, specific risks discussed. Consent was given by the patient. Immediately prior to procedure a time out was called to verify the correct patient, procedure, equipment, support staff and site/side marked as required. Patient was prepped and draped in the usual sterile fashion.     Plan: She will remove the Ace bandage was applied before returning to bed this evening.  She knows to wait 3 months between injections.  Follow-up as needed.  Questions encouraged and answered.

## 2022-10-03 ENCOUNTER — Encounter: Payer: Self-pay | Admitting: Podiatry

## 2022-10-03 ENCOUNTER — Ambulatory Visit: Payer: Medicare HMO | Admitting: Podiatry

## 2022-10-03 DIAGNOSIS — B351 Tinea unguium: Secondary | ICD-10-CM | POA: Diagnosis not present

## 2022-10-03 DIAGNOSIS — E1140 Type 2 diabetes mellitus with diabetic neuropathy, unspecified: Secondary | ICD-10-CM | POA: Diagnosis not present

## 2022-10-03 NOTE — Progress Notes (Signed)
This patient returns to my office for at risk foot care.  This patient requires this care by a professional since this patient will be at risk due to having type 2 diabetes.  Patient has painful callus left foot.  This patient is unable to cut nails herself since the patient cannot reach her nails.These nails are painful walking and wearing shoes. Patient has painful callus left foot. This patient presents for at risk foot care today.  General Appearance  Alert, conversant and in no acute stress.  Vascular  Dorsalis pedis and posterior tibial  pulses are palpable  bilaterally.  Capillary return is within normal limits  bilaterally. Temperature is within normal limits  bilaterally.  Neurologic  Senn-Weinstein monofilament wire test within normal limits  bilaterally. Muscle power within normal limits bilaterally.  Nails Thick disfigured discolored nails with subungual debris  from hallux to fifth toes bilaterally. No evidence of bacterial infection or drainage bilaterally.  Orthopedic  No limitations of motion  feet .  No crepitus or effusions noted.  No bony pathology or digital deformities noted.  Skin  normotropic skin with no porokeratosis noted bilaterally.  No signs of infections or ulcers noted.     Onychomycosis  Pain in right toes  Pain in left toes  Consent was obtained for treatment procedures.   Mechanical debridement of nails 1-5  bilaterally performed with a nail nipper.  Filed with dremel without incident.    Return office visit    12 weeks                 Told patient to return for periodic foot care and evaluation due to potential at risk complications.   Syliva Mee DPM  

## 2022-11-07 ENCOUNTER — Emergency Department (HOSPITAL_COMMUNITY)
Admission: EM | Admit: 2022-11-07 | Discharge: 2022-11-07 | Disposition: A | Discharge status: Home / Self Care | Payer: Medicare HMO | Attending: Emergency Medicine | Admitting: Emergency Medicine

## 2022-11-07 ENCOUNTER — Encounter (HOSPITAL_COMMUNITY): Payer: Self-pay

## 2022-11-07 ENCOUNTER — Other Ambulatory Visit: Payer: Self-pay

## 2022-11-07 DIAGNOSIS — N179 Acute kidney failure, unspecified: Secondary | ICD-10-CM | POA: Insufficient documentation

## 2022-11-07 DIAGNOSIS — E119 Type 2 diabetes mellitus without complications: Secondary | ICD-10-CM | POA: Insufficient documentation

## 2022-11-07 DIAGNOSIS — I10 Essential (primary) hypertension: Secondary | ICD-10-CM | POA: Insufficient documentation

## 2022-11-07 DIAGNOSIS — R42 Dizziness and giddiness: Secondary | ICD-10-CM | POA: Diagnosis present

## 2022-11-07 DIAGNOSIS — E860 Dehydration: Secondary | ICD-10-CM | POA: Insufficient documentation

## 2022-11-07 LAB — BASIC METABOLIC PANEL
Anion gap: 13 (ref 5–15)
BUN: 37 mg/dL — ABNORMAL HIGH (ref 8–23)
CO2: 18 mmol/L — ABNORMAL LOW (ref 22–32)
Calcium: 9.8 mg/dL (ref 8.9–10.3)
Chloride: 107 mmol/L (ref 98–111)
Creatinine, Ser: 2.2 mg/dL — ABNORMAL HIGH (ref 0.44–1.00)
GFR, Estimated: 22 mL/min — ABNORMAL LOW (ref >60)
Glucose, Bld: 92 mg/dL (ref 70–99)
Potassium: 4.4 mmol/L (ref 3.5–5.1)
Sodium: 138 mmol/L (ref 135–145)

## 2022-11-07 LAB — URINALYSIS, ROUTINE W REFLEX MICROSCOPIC
Bilirubin Urine: NEGATIVE
Glucose, UA: NEGATIVE mg/dL
Hgb urine dipstick: NEGATIVE
Ketones, ur: 5 mg/dL — AB
Nitrite: NEGATIVE
Protein, ur: 30 mg/dL — AB
Specific Gravity, Urine: 1.018 (ref 1.005–1.030)
pH: 5 (ref 5.0–8.0)

## 2022-11-07 LAB — CBC
HCT: 45.8 % (ref 36.0–46.0)
Hemoglobin: 15 g/dL (ref 12.0–15.0)
MCH: 29.6 pg (ref 26.0–34.0)
MCHC: 32.8 g/dL (ref 30.0–36.0)
MCV: 90.5 fL (ref 80.0–100.0)
Platelets: 176 10*3/uL (ref 150–400)
RBC: 5.06 MIL/uL (ref 3.87–5.11)
RDW: 13.9 % (ref 11.5–15.5)
WBC: 4.6 10*3/uL (ref 4.0–10.5)
nRBC: 0 % (ref 0.0–0.2)

## 2022-11-07 LAB — CBG MONITORING, ED
Glucose-Capillary: 64 mg/dL — ABNORMAL LOW (ref 70–99)
Glucose-Capillary: 52 mg/dL — ABNORMAL LOW (ref 70–99)
Glucose-Capillary: 81 mg/dL (ref 70–99)

## 2022-11-07 LAB — TROPONIN I (HIGH SENSITIVITY): Troponin I (High Sensitivity): 12 ng/L (ref <18)

## 2022-11-07 MED ORDER — LACTATED RINGERS IV BOLUS
1000.0000 mL | Freq: Once | INTRAVENOUS | Status: AC
  Administered 2022-11-07: 1000 mL via INTRAVENOUS

## 2022-11-07 NOTE — ED Triage Notes (Signed)
Patient arrived by [facility] with near syncopal episode while working this am. Patient was working at [employer] and started at 330 with no breakfast. Felt hot and clammy. Now alert and oriented. Denies pain.

## 2022-11-07 NOTE — ED Provider Notes (Signed)
[place] EMERGENCY DEPARTMENT AT [facility] Provider Note   HPI: Sharon Michael is a 77 year old female presenting today with a syncopal episode.  She reports over the last week she has been struggling with a gout flare that is since improved.  She reports with this she has not been eating or drinking very well.  She also reports today at work she started feeling lightheaded.  She had an episode of nausea and diaphoresis and feeling as though she was going to faint.  Her staff report they called EMS for evaluation.  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  EMS reports she was vitally stable and had a normal sugar.  They transported her for further evaluation.  She reports feeling back to her baseline.  Past Medical History:  Diagnosis Date   Diabetes mellitus without complication (HCC)    Hyperlipidemia    Hypertension     Past Surgical History:  Procedure Laterality Date   ABDOMINAL HYSTERECTOMY     BREAST BIOPSY Right      Social History   Tobacco Use   Smoking status: Never   Smokeless tobacco: Never  Substance Use Topics   Alcohol use: No   Drug use: No      Review of Systems  A complete ROS was performed with pertinent positives/negatives noted in the HPI.   Vitals:   11/07/22 1357 11/07/22 1646  BP: (!) 155/78 (!) 147/76  Pulse: (!) 45 (!) 50  Resp: 18 16  Temp: 97.8 F (36.6 C) 98 F (36.7 C)  SpO2: 99% 100%    Physical Exam Vitals and nursing note reviewed.  Constitutional:      General: She is not in acute distress.    Appearance: She is well-developed. She is not ill-appearing.  Cardiovascular:     Rate and Rhythm: Normal rate and regular rhythm.     Pulses: Normal pulses.     Heart sounds: Normal heart sounds. No murmur heard.    No friction rub. No gallop.  Pulmonary:     Effort: Pulmonary effort is normal. No respiratory distress.     Breath sounds: Normal breath sounds. No stridor. No wheezing, rhonchi or  rales.  Abdominal:     General: Abdomen is flat. There is no distension.     Palpations: Abdomen is soft.     Tenderness: There is no abdominal tenderness. There is no guarding or rebound.  Musculoskeletal:        General: No swelling. Normal range of motion.     Cervical back: Neck supple.  Skin:    General: Skin is warm and dry.     Capillary Refill: Capillary refill takes less than 2 seconds.  Neurological:     Mental Status: She is alert.     Sensory: Sensation is intact. No sensory deficit.     Motor: Motor function is intact. No weakness, tremor, atrophy, abnormal muscle tone, seizure activity or pronator drift.     Coordination: Coordination is intact. Coordination normal. Finger-Nose-Finger Test normal.     Comments: CRANIAL NERVES: 2 (Optic) - Visual fields intact. PERRL brisk.  3/4/6 (Oculomotor, Trochlear, Abducens) - EOM full without nystagmus 5 (Trigeminal) - sensation intact to light touch 7 (Facial) - No facial weakness or asymmetry.  8 (Vestibulococchlear) - responds to voice, auditory acuity grossly normal 9/10 (Glossopharyngeal) - symmetric palate and uvula elevation 11 (Spinal accessory) - head midline, Normal and symmetric sternocleidomastoid and trapezius strength  12 (Hypoglossal) -  tongue midline      Procedures  MDM:   EKG results: ECG on my interpretation is normal sinus rhythm and rate, without anatomical ischemia representing STEMI, New-onset Arrhythmia, or ischemic equivalent.    Lab results:  Results for orders placed or performed during the hospital encounter of 11/07/22 (from the past 24 hour(s))  Basic metabolic panel     Status: Abnormal   Collection Time: 11/07/22  2:15 PM  Result Value Ref Range   Sodium 138 135 - 145 mmol/L   Potassium 4.4 3.5 - 5.1 mmol/L   Chloride 107 98 - 111 mmol/L   CO2 18 (L) 22 - 32 mmol/L   Glucose, Bld 92 70 - 99 mg/dL   BUN 37 (H) 8 - 23 mg/dL   Creatinine, Ser 1.61 (H) 0.44 - 1.00 mg/dL   Calcium 9.8 8.9  - 09.6 mg/dL   GFR, Estimated 22 (L) >60 mL/min   Anion gap 13 5 - 15  CBC     Status: None   Collection Time: 11/07/22  2:15 PM  Result Value Ref Range   WBC 4.6 4.0 - 10.5 K/uL   RBC 5.06 3.87 - 5.11 MIL/uL   Hemoglobin 15.0 12.0 - 15.0 g/dL   HCT 04.5 40.9 - 81.1 %   MCV 90.5 80.0 - 100.0 fL   MCH 29.6 26.0 - 34.0 pg   MCHC 32.8 30.0 - 36.0 g/dL   RDW 91.4 78.2 - 95.6 %   Platelets 176 150 - 400 K/uL   nRBC 0.0 0.0 - 0.2 %  Troponin I (High Sensitivity)     Status: None   Collection Time: 11/07/22  2:15 PM  Result Value Ref Range   Troponin I (High Sensitivity) 12 <18 ng/L  Urinalysis, Routine w reflex microscopic -Urine, Clean Catch     Status: Abnormal   Collection Time: 11/07/22  2:33 PM  Result Value Ref Range   Color, Urine YELLOW YELLOW   APPearance CLOUDY (A) CLEAR   Specific Gravity, Urine 1.018 1.005 - 1.030   pH 5.0 5.0 - 8.0   Glucose, UA NEGATIVE NEGATIVE mg/dL   Hgb urine dipstick NEGATIVE NEGATIVE   Bilirubin Urine NEGATIVE NEGATIVE   Ketones, ur 5 (A) NEGATIVE mg/dL   Protein, ur 30 (A) NEGATIVE mg/dL   Nitrite NEGATIVE NEGATIVE   Leukocytes,Ua TRACE (A) NEGATIVE   RBC / HPF 0-5 0 - 5 RBC/hpf   WBC, UA 0-5 0 - 5 WBC/hpf   Bacteria, UA RARE (A) NONE SEEN   Squamous Epithelial / HPF 0-5 0 - 5 /HPF   Mucus PRESENT    Hyaline Casts, UA PRESENT   CBG monitoring, ED     Status: Abnormal   Collection Time: 11/07/22  5:05 PM  Result Value Ref Range   Glucose-Capillary 64 (L) 70 - 99 mg/dL   Comment 1 Notify RN    Comment 2 Document in Chart   CBG monitoring, ED     Status: Abnormal   Collection Time: 11/07/22  5:57 PM  Result Value Ref Range   Glucose-Capillary 52 (L) 70 - 99 mg/dL  CBG monitoring, ED     Status: None   Collection Time: 11/07/22  5:59 PM  Result Value Ref Range   Glucose-Capillary 81 70 - 99 mg/dL     Key medications administered in the ER:  Medications  lactated ringers bolus 1,000 mL (0 mLs Intravenous Stopped 11/07/22 1821)     Medical decision making: -Vital signs stable. Patient afebrile, hemodynamically  stable, and non-toxic appearing. -Patient's presentation is most consistent with acute complicated illness / injury requiring diagnostic workup.. -Sharon Michael is a 77 y.o. female presenting to the emergency department with a syncopal episode.  Patient with a nonfocal neurologic exam, do not suspect CVA.  EKG does not show evidence of arrhythmia, STEMI, or ischemic equivalent.  Troponin is normal, do not suspect ACS.  Presentation less consistent with cardiogenic cause to syncope.  No murmurs to suggest heart valvular abnormality.  No recent medication changes to suggest pharmacologic cause.  No reported seizure-like activity.  Labs show no evidence of hypoglycemia.  UA without UTI.  Sugar is normal.  CBC does not show anemia or acute hematologic abnormality.  Metabolic panel is notable for AKI which looks to be prerenal in nature.  Will give IV fluids.  Presentation most consistent with orthostatic hypotension in the setting of dehydration.  Patient is stable for discharge, I discussed strict return precautions for ED return.  Discussed follow-up with PCP for repeat kidney function testing.  Patient discharged.   Medical Decision Making Amount and/or Complexity of Data Reviewed Labs: ordered.     The plan for this patient was discussed with Dr. Dalene Seltzer, who voiced agreement and who oversaw evaluation and treatment of this patient.  Marta Lamas, MD Emergency Medicine, PGY-3  Note: Dragon medical dictation software was used in the creation of this note.   Clinical Impression:  1. Dehydration   2. Dizziness   3. AKI (acute kidney injury) (HCC)          Chase Caller, MD 11/07/22 3086    Alvira Monday, MD 11/08/22 [phone]

## 2022-11-07 NOTE — ED Notes (Signed)
Previous RN reported that patient ambulated independently to restroom using her cane. Patient observed standing steadily at bedside with no assistance.

## 2022-11-07 NOTE — ED Notes (Signed)
Pt was stuck three times and was unsuccessful. sdimery

## 2022-11-07 NOTE — ED Provider Triage Note (Addendum)
Emergency Medicine Provider Triage Evaluation Note  Sharon Michael , a 77 y.o. female  was evaluated in triage.  Pt complains of near syncope.  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 Takes meds including bs med in pm. Does not think bs was low but did not take bs Review of Systems  Positive: Near syncope Negative: Chest pain, dyspnea, pain  Physical Exam  BP (!) 143/73   Pulse 69   Temp 97.6 F (36.4 C)   Resp 18   SpO2 100%  Gen:   Awake, no distress   Resp:  Normal effort  MSK:   Moves extremities without difficulty  Other:  rrr  Medical Decision Making  Medically screening exam initiated at 9:50 AM.  Appropriate orders placed.  Sharon Michael was informed that the remainder of the evaluation will be completed by another provider, this initial triage assessment does not replace that evaluation, and the importance of remaining in the ED until their evaluation is complete.  Will need labs , monitor, EKG Nursing advised patient should be on monitor   Margarita Grizzle, MD 11/07/22 2952    Margarita Grizzle, MD 11/07/22 [phone]

## 2022-11-07 NOTE — ED Triage Notes (Signed)
EMS stated, generalized weakness and feel like she is going to pass out. Not great PO in the past week, not eating good. She is probably dehydrated.

## 2022-11-09 ENCOUNTER — Other Ambulatory Visit: Payer: Self-pay | Admitting: Internal Medicine

## 2022-11-10 LAB — BASIC METABOLIC PANEL WITH GFR
BUN/Creatinine Ratio: 18 (calc) (ref 6–22)
BUN: 33 mg/dL — ABNORMAL HIGH (ref 7–25)
CO2: 19 mmol/L — ABNORMAL LOW (ref 20–32)
Calcium: 9 mg/dL (ref 8.6–10.4)
Chloride: 109 mmol/L (ref 98–110)
Creat: 1.82 mg/dL — ABNORMAL HIGH (ref 0.60–1.00)
Glucose, Bld: 81 mg/dL (ref 65–99)
Potassium: 4 mmol/L (ref 3.5–5.3)
Sodium: 143 mmol/L (ref 135–146)
eGFR: 28 mL/min/{1.73_m2} — ABNORMAL LOW (ref >=60)

## 2022-11-10 LAB — URIC ACID: Uric Acid, Serum: 12 mg/dL — ABNORMAL HIGH (ref 2.5–7.0)

## 2022-11-10 LAB — EXTRA LAV TOP TUBE

## 2022-11-10 LAB — VITAMIN D 25 HYDROXY (VIT D DEFICIENCY, FRACTURES): Vit D, 25-Hydroxy: 94 ng/mL (ref 30–100)

## 2023-01-02 ENCOUNTER — Encounter: Payer: Self-pay | Admitting: Podiatry

## 2023-01-02 ENCOUNTER — Ambulatory Visit: Payer: Medicare HMO | Admitting: Podiatry

## 2023-01-02 DIAGNOSIS — M79675 Pain in left toe(s): Secondary | ICD-10-CM | POA: Diagnosis not present

## 2023-01-02 DIAGNOSIS — B351 Tinea unguium: Secondary | ICD-10-CM | POA: Diagnosis not present

## 2023-01-02 DIAGNOSIS — I739 Peripheral vascular disease, unspecified: Secondary | ICD-10-CM

## 2023-01-02 DIAGNOSIS — E1140 Type 2 diabetes mellitus with diabetic neuropathy, unspecified: Secondary | ICD-10-CM

## 2023-01-02 DIAGNOSIS — M79674 Pain in right toe(s): Secondary | ICD-10-CM

## 2023-01-02 NOTE — Progress Notes (Signed)
This patient returns to my office for at risk foot care.  This patient requires this care by a professional since this patient will be at risk due to having type 2 diabetes.  Patient has painful callus left foot.  This patient is unable to cut nails herself since the patient cannot reach her nails.These nails are painful walking and wearing shoes. Patient has painful callus left foot. This patient presents for at risk foot care today.  General Appearance  Alert, conversant and in no acute stress.  Vascular  Dorsalis pedis and posterior tibial  pulses are palpable  bilaterally.  Capillary return is within normal limits  bilaterally. Temperature is within normal limits  bilaterally.  Neurologic  Senn-Weinstein monofilament wire test within normal limits  bilaterally. Muscle power within normal limits bilaterally.  Nails Thick disfigured discolored nails with subungual debris  from hallux to fifth toes bilaterally. No evidence of bacterial infection or drainage bilaterally.  Orthopedic  No limitations of motion  feet .  No crepitus or effusions noted.  No bony pathology or digital deformities noted.  Skin  normotropic skin with no porokeratosis noted bilaterally.  No signs of infections or ulcers noted.     Onychomycosis  Pain in right toes  Pain in left toes  Consent was obtained for treatment procedures.   Mechanical debridement of nails 1-5  bilaterally performed with a nail nipper.  Filed with dremel without incident.    Return office visit    12 weeks                 Told patient to return for periodic foot care and evaluation due to potential at risk complications.   Gardiner Barefoot DPM

## 2023-02-28 ENCOUNTER — Ambulatory Visit: Payer: Medicare HMO | Admitting: Podiatry

## 2023-03-02 ENCOUNTER — Encounter (HOSPITAL_COMMUNITY): Payer: Self-pay

## 2023-03-02 ENCOUNTER — Inpatient Hospital Stay (HOSPITAL_COMMUNITY)
Admission: EM | Admit: 2023-03-02 | Discharge: 2023-03-08 | DRG: 683 | Disposition: A | Discharge status: Skilled Nursing Facility | Payer: Medicare Other | Attending: Family Medicine | Admitting: Family Medicine

## 2023-03-02 DIAGNOSIS — I82541 Chronic embolism and thrombosis of right tibial vein: Secondary | ICD-10-CM | POA: Diagnosis not present

## 2023-03-02 DIAGNOSIS — Z841 Family history of disorders of kidney and ureter: Secondary | ICD-10-CM | POA: Diagnosis not present

## 2023-03-02 DIAGNOSIS — I82531 Chronic embolism and thrombosis of right popliteal vein: Secondary | ICD-10-CM | POA: Diagnosis present

## 2023-03-02 DIAGNOSIS — E860 Dehydration: Secondary | ICD-10-CM | POA: Diagnosis present

## 2023-03-02 DIAGNOSIS — E1140 Type 2 diabetes mellitus with diabetic neuropathy, unspecified: Secondary | ICD-10-CM | POA: Diagnosis not present

## 2023-03-02 DIAGNOSIS — Z90710 Acquired absence of both cervix and uterus: Secondary | ICD-10-CM

## 2023-03-02 DIAGNOSIS — E1122 Type 2 diabetes mellitus with diabetic chronic kidney disease: Secondary | ICD-10-CM | POA: Diagnosis present

## 2023-03-02 DIAGNOSIS — D696 Thrombocytopenia, unspecified: Secondary | ICD-10-CM | POA: Diagnosis not present

## 2023-03-02 DIAGNOSIS — M109 Gout, unspecified: Secondary | ICD-10-CM | POA: Diagnosis not present

## 2023-03-02 DIAGNOSIS — R32 Unspecified urinary incontinence: Secondary | ICD-10-CM | POA: Diagnosis not present

## 2023-03-02 DIAGNOSIS — I82551 Chronic embolism and thrombosis of right peroneal vein: Secondary | ICD-10-CM | POA: Diagnosis not present

## 2023-03-02 DIAGNOSIS — M79605 Pain in left leg: Secondary | ICD-10-CM | POA: Diagnosis not present

## 2023-03-02 DIAGNOSIS — Z833 Family history of diabetes mellitus: Secondary | ICD-10-CM

## 2023-03-02 DIAGNOSIS — Z79899 Other long term (current) drug therapy: Secondary | ICD-10-CM | POA: Diagnosis not present

## 2023-03-02 DIAGNOSIS — I129 Hypertensive chronic kidney disease with stage 1 through stage 4 chronic kidney disease, or unspecified chronic kidney disease: Secondary | ICD-10-CM | POA: Diagnosis present

## 2023-03-02 DIAGNOSIS — I825Z9 Chronic embolism and thrombosis of unspecified deep veins of unspecified distal lower extremity: Secondary | ICD-10-CM

## 2023-03-02 DIAGNOSIS — N170 Acute kidney failure with tubular necrosis: Principal | ICD-10-CM | POA: Diagnosis present

## 2023-03-02 DIAGNOSIS — I824Y9 Acute embolism and thrombosis of unspecified deep veins of unspecified proximal lower extremity: Secondary | ICD-10-CM | POA: Insufficient documentation

## 2023-03-02 DIAGNOSIS — N1832 Chronic kidney disease, stage 3b: Secondary | ICD-10-CM | POA: Diagnosis not present

## 2023-03-02 DIAGNOSIS — M79604 Pain in right leg: Secondary | ICD-10-CM | POA: Diagnosis present

## 2023-03-02 DIAGNOSIS — E876 Hypokalemia: Secondary | ICD-10-CM | POA: Diagnosis not present

## 2023-03-02 DIAGNOSIS — Z8249 Family history of ischemic heart disease and other diseases of the circulatory system: Secondary | ICD-10-CM

## 2023-03-02 DIAGNOSIS — Z7984 Long term (current) use of oral hypoglycemic drugs: Secondary | ICD-10-CM | POA: Diagnosis not present

## 2023-03-02 DIAGNOSIS — M6282 Rhabdomyolysis: Secondary | ICD-10-CM | POA: Diagnosis not present

## 2023-03-02 DIAGNOSIS — E785 Hyperlipidemia, unspecified: Secondary | ICD-10-CM | POA: Diagnosis present

## 2023-03-02 DIAGNOSIS — I10 Essential (primary) hypertension: Secondary | ICD-10-CM

## 2023-03-02 DIAGNOSIS — G8929 Other chronic pain: Secondary | ICD-10-CM | POA: Diagnosis not present

## 2023-03-02 DIAGNOSIS — N179 Acute kidney failure, unspecified: Principal | ICD-10-CM | POA: Insufficient documentation

## 2023-03-02 NOTE — ED Triage Notes (Signed)
Pt is coming in for chronic leg pain, states it swelling more than normal. She is coming from home. She has been unable to walk so she has been rolling around her home. She has an appointment with her foot person on Monday but felt like she could no longer take it. No chest pain, no shortness of breath.  Medic vitals   118/78 100hr 18rr 98%ra 163bgl

## 2023-03-03 ENCOUNTER — Emergency Department (HOSPITAL_BASED_OUTPATIENT_CLINIC_OR_DEPARTMENT_OTHER): Payer: Medicare Other

## 2023-03-03 ENCOUNTER — Other Ambulatory Visit: Payer: Self-pay

## 2023-03-03 ENCOUNTER — Emergency Department (HOSPITAL_COMMUNITY): Payer: Medicare Other

## 2023-03-03 DIAGNOSIS — N1832 Chronic kidney disease, stage 3b: Secondary | ICD-10-CM

## 2023-03-03 DIAGNOSIS — M79661 Pain in right lower leg: Secondary | ICD-10-CM

## 2023-03-03 DIAGNOSIS — Z79899 Other long term (current) drug therapy: Secondary | ICD-10-CM | POA: Diagnosis not present

## 2023-03-03 DIAGNOSIS — I10 Essential (primary) hypertension: Secondary | ICD-10-CM

## 2023-03-03 DIAGNOSIS — I825Z9 Chronic embolism and thrombosis of unspecified deep veins of unspecified distal lower extremity: Secondary | ICD-10-CM

## 2023-03-03 DIAGNOSIS — N179 Acute kidney failure, unspecified: Secondary | ICD-10-CM | POA: Diagnosis not present

## 2023-03-03 DIAGNOSIS — G8929 Other chronic pain: Secondary | ICD-10-CM | POA: Diagnosis not present

## 2023-03-03 DIAGNOSIS — M79604 Pain in right leg: Secondary | ICD-10-CM | POA: Diagnosis present

## 2023-03-03 DIAGNOSIS — E1140 Type 2 diabetes mellitus with diabetic neuropathy, unspecified: Secondary | ICD-10-CM | POA: Diagnosis not present

## 2023-03-03 DIAGNOSIS — R32 Unspecified urinary incontinence: Secondary | ICD-10-CM | POA: Diagnosis not present

## 2023-03-03 DIAGNOSIS — I82551 Chronic embolism and thrombosis of right peroneal vein: Secondary | ICD-10-CM | POA: Diagnosis not present

## 2023-03-03 DIAGNOSIS — I82531 Chronic embolism and thrombosis of right popliteal vein: Secondary | ICD-10-CM | POA: Diagnosis not present

## 2023-03-03 DIAGNOSIS — D696 Thrombocytopenia, unspecified: Secondary | ICD-10-CM | POA: Diagnosis not present

## 2023-03-03 DIAGNOSIS — Z833 Family history of diabetes mellitus: Secondary | ICD-10-CM | POA: Diagnosis not present

## 2023-03-03 DIAGNOSIS — M109 Gout, unspecified: Secondary | ICD-10-CM | POA: Insufficient documentation

## 2023-03-03 DIAGNOSIS — E876 Hypokalemia: Secondary | ICD-10-CM

## 2023-03-03 DIAGNOSIS — N170 Acute kidney failure with tubular necrosis: Secondary | ICD-10-CM | POA: Diagnosis not present

## 2023-03-03 DIAGNOSIS — Z841 Family history of disorders of kidney and ureter: Secondary | ICD-10-CM | POA: Diagnosis not present

## 2023-03-03 DIAGNOSIS — E1122 Type 2 diabetes mellitus with diabetic chronic kidney disease: Secondary | ICD-10-CM | POA: Diagnosis not present

## 2023-03-03 DIAGNOSIS — Z8249 Family history of ischemic heart disease and other diseases of the circulatory system: Secondary | ICD-10-CM | POA: Diagnosis not present

## 2023-03-03 DIAGNOSIS — Z7984 Long term (current) use of oral hypoglycemic drugs: Secondary | ICD-10-CM | POA: Diagnosis not present

## 2023-03-03 DIAGNOSIS — Z90710 Acquired absence of both cervix and uterus: Secondary | ICD-10-CM | POA: Diagnosis not present

## 2023-03-03 DIAGNOSIS — I82541 Chronic embolism and thrombosis of right tibial vein: Secondary | ICD-10-CM | POA: Diagnosis not present

## 2023-03-03 DIAGNOSIS — I129 Hypertensive chronic kidney disease with stage 1 through stage 4 chronic kidney disease, or unspecified chronic kidney disease: Secondary | ICD-10-CM | POA: Diagnosis not present

## 2023-03-03 DIAGNOSIS — E785 Hyperlipidemia, unspecified: Secondary | ICD-10-CM | POA: Diagnosis not present

## 2023-03-03 DIAGNOSIS — M79605 Pain in left leg: Secondary | ICD-10-CM | POA: Diagnosis not present

## 2023-03-03 DIAGNOSIS — E860 Dehydration: Secondary | ICD-10-CM | POA: Diagnosis not present

## 2023-03-03 DIAGNOSIS — M6282 Rhabdomyolysis: Secondary | ICD-10-CM | POA: Diagnosis not present

## 2023-03-03 LAB — CBC
HCT: 37.7 % (ref 36.0–46.0)
Hemoglobin: 12.6 g/dL (ref 12.0–15.0)
MCH: 30.1 pg (ref 26.0–34.0)
MCHC: 33.4 g/dL (ref 30.0–36.0)
MCV: 90.2 fL (ref 80.0–100.0)
Platelets: 159 10*3/uL (ref 150–400)
RBC: 4.18 MIL/uL (ref 3.87–5.11)
RDW: 14.9 % (ref 11.5–15.5)
WBC: 9.8 10*3/uL (ref 4.0–10.5)
nRBC: 0 % (ref 0.0–0.2)

## 2023-03-03 LAB — URINALYSIS, ROUTINE W REFLEX MICROSCOPIC
Bilirubin Urine: NEGATIVE
Glucose, UA: NEGATIVE mg/dL
Ketones, ur: NEGATIVE mg/dL
Nitrite: NEGATIVE
Protein, ur: 30 mg/dL — AB
Specific Gravity, Urine: 1.015 (ref 1.005–1.030)
pH: 5 (ref 5.0–8.0)

## 2023-03-03 LAB — COMPREHENSIVE METABOLIC PANEL
ALT: 29 U/L (ref 0–44)
ALT: 29 U/L (ref 0–44)
AST: 39 U/L (ref 15–41)
AST: 45 U/L — ABNORMAL HIGH (ref 15–41)
Albumin: 2.9 g/dL — ABNORMAL LOW (ref 3.5–5.0)
Albumin: 3.2 g/dL — ABNORMAL LOW (ref 3.5–5.0)
Alkaline Phosphatase: 63 U/L (ref 38–126)
Alkaline Phosphatase: 68 U/L (ref 38–126)
Anion gap: 17 — ABNORMAL HIGH (ref 5–15)
Anion gap: 22 — ABNORMAL HIGH (ref 5–15)
BUN: 56 mg/dL — ABNORMAL HIGH (ref 8–23)
BUN: 58 mg/dL — ABNORMAL HIGH (ref 8–23)
CO2: 18 mmol/L — ABNORMAL LOW (ref 22–32)
CO2: 22 mmol/L (ref 22–32)
Calcium: 9.1 mg/dL (ref 8.9–10.3)
Calcium: 9.2 mg/dL (ref 8.9–10.3)
Chloride: 100 mmol/L (ref 98–111)
Chloride: 104 mmol/L (ref 98–111)
Creatinine, Ser: 1.92 mg/dL — ABNORMAL HIGH (ref 0.44–1.00)
Creatinine, Ser: 2.51 mg/dL — ABNORMAL HIGH (ref 0.44–1.00)
GFR, Estimated: 19 mL/min — ABNORMAL LOW (ref >60)
GFR, Estimated: 26 mL/min — ABNORMAL LOW (ref >60)
Glucose, Bld: 130 mg/dL — ABNORMAL HIGH (ref 70–99)
Glucose, Bld: 153 mg/dL — ABNORMAL HIGH (ref 70–99)
Potassium: 3.2 mmol/L — ABNORMAL LOW (ref 3.5–5.1)
Potassium: 3.3 mmol/L — ABNORMAL LOW (ref 3.5–5.1)
Sodium: 139 mmol/L (ref 135–145)
Sodium: 144 mmol/L (ref 135–145)
Total Bilirubin: 1.2 mg/dL (ref 0.3–1.2)
Total Bilirubin: 1.4 mg/dL — ABNORMAL HIGH (ref 0.3–1.2)
Total Protein: 7.2 g/dL (ref 6.5–8.1)
Total Protein: 7.5 g/dL (ref 6.5–8.1)

## 2023-03-03 LAB — GLUCOSE, CAPILLARY
Glucose-Capillary: 126 mg/dL — ABNORMAL HIGH (ref 70–99)
Glucose-Capillary: 99 mg/dL (ref 70–99)
Glucose-Capillary: 151 mg/dL — ABNORMAL HIGH (ref 70–99)

## 2023-03-03 LAB — CK: Total CK: 1006 U/L — ABNORMAL HIGH (ref 38–234)

## 2023-03-03 LAB — CREATININE, URINE, RANDOM: Creatinine, Urine: 100 mg/dL

## 2023-03-03 LAB — SODIUM, URINE, RANDOM: Sodium, Ur: 62 mmol/L

## 2023-03-03 LAB — BRAIN NATRIURETIC PEPTIDE: B Natriuretic Peptide: 67.2 pg/mL (ref 0.0–100.0)

## 2023-03-03 LAB — MAGNESIUM: Magnesium: 2 mg/dL (ref 1.7–2.4)

## 2023-03-03 MED ORDER — NETARSUDIL-LATANOPROST 0.02-0.005 % OP SOLN: 1.0000 [drp] | Freq: Every day | OPHTHALMIC | Status: DC

## 2023-03-03 MED ORDER — GABAPENTIN 100 MG PO CAPS
100.0000 mg | ORAL_CAPSULE | Freq: Two times a day (BID) | ORAL | Status: DC
  Administered 2023-03-03 – 2023-03-08 (×11): 100 mg via ORAL
  Filled 2023-03-03 (×11): qty 1

## 2023-03-03 MED ORDER — APIXABAN 5 MG PO TABS
10.0000 mg | ORAL_TABLET | Freq: Two times a day (BID) | ORAL | Status: DC
  Administered 2023-03-03 – 2023-03-08 (×11): 10 mg via ORAL
  Filled 2023-03-03 (×11): qty 2

## 2023-03-03 MED ORDER — ALLOPURINOL 100 MG PO TABS
100.0000 mg | ORAL_TABLET | Freq: Every day | ORAL | Status: DC
  Administered 2023-03-03 – 2023-03-08 (×6): 100 mg via ORAL
  Filled 2023-03-03 (×6): qty 1

## 2023-03-03 MED ORDER — SODIUM CHLORIDE 0.9 % IV BOLUS
500.0000 mL | Freq: Once | INTRAVENOUS | Status: AC
  Administered 2023-03-03: 500 mL via INTRAVENOUS

## 2023-03-03 MED ORDER — SODIUM CHLORIDE 0.9 % IV BOLUS: 1000.0000 mL | Freq: Once | INTRAVENOUS | Status: DC

## 2023-03-03 MED ORDER — OXYCODONE-ACETAMINOPHEN 5-325 MG PO TABS
1.0000 | ORAL_TABLET | ORAL | Status: DC | PRN
  Filled 2023-03-03 (×2): qty 1

## 2023-03-03 MED ORDER — ACETAMINOPHEN 325 MG PO TABS: 650.0000 mg | ORAL_TABLET | Freq: Four times a day (QID) | ORAL | Status: DC | PRN

## 2023-03-03 MED ORDER — NABUMETONE 500 MG PO TABS
500.0000 mg | ORAL_TABLET | Freq: Two times a day (BID) | ORAL | Status: DC
  Filled 2023-03-03 (×2): qty 1

## 2023-03-03 MED ORDER — SODIUM CHLORIDE 0.9 % IV SOLN: INTRAVENOUS | Status: AC

## 2023-03-03 MED ORDER — GABAPENTIN 100 MG PO CAPS: 100.0000 mg | ORAL_CAPSULE | Freq: Two times a day (BID) | ORAL | Status: DC

## 2023-03-03 MED ORDER — ACETAMINOPHEN 650 MG RE SUPP: 650.0000 mg | Freq: Four times a day (QID) | RECTAL | Status: DC | PRN

## 2023-03-03 MED ORDER — INSULIN ASPART 100 UNIT/ML IJ SOLN
0.0000 [IU] | Freq: Three times a day (TID) | INTRAMUSCULAR | Status: DC
  Administered 2023-03-04 (×2): 1 [IU] via SUBCUTANEOUS

## 2023-03-03 MED ORDER — METOPROLOL SUCCINATE ER 100 MG PO TB24
100.0000 mg | ORAL_TABLET | Freq: Every day | ORAL | Status: DC
  Administered 2023-03-03 – 2023-03-08 (×6): 100 mg via ORAL
  Filled 2023-03-03 (×7): qty 1

## 2023-03-03 MED ORDER — OXYBUTYNIN CHLORIDE 5 MG PO TABS
5.0000 mg | ORAL_TABLET | Freq: Two times a day (BID) | ORAL | Status: DC
  Administered 2023-03-03 – 2023-03-08 (×11): 5 mg via ORAL
  Filled 2023-03-03 (×13): qty 1

## 2023-03-03 MED ORDER — POTASSIUM CHLORIDE CRYS ER 20 MEQ PO TBCR
20.0000 meq | EXTENDED_RELEASE_TABLET | Freq: Once | ORAL | Status: AC
  Administered 2023-03-03: 20 meq via ORAL
  Filled 2023-03-03: qty 1

## 2023-03-03 MED ORDER — APIXABAN 5 MG PO TABS: 5.0000 mg | ORAL_TABLET | Freq: Two times a day (BID) | ORAL | Status: DC

## 2023-03-03 MED ORDER — ATORVASTATIN CALCIUM 40 MG PO TABS
40.0000 mg | ORAL_TABLET | Freq: Every day | ORAL | Status: DC
  Administered 2023-03-03: 40 mg via ORAL
  Filled 2023-03-03: qty 1

## 2023-03-03 MED ORDER — ATORVASTATIN CALCIUM 40 MG PO TABS
40.0000 mg | ORAL_TABLET | Freq: Every day | ORAL | Status: DC
  Administered 2023-03-05 – 2023-03-08 (×4): 40 mg via ORAL
  Filled 2023-03-03: qty 1
  Filled 2023-03-03: qty 4
  Filled 2023-03-03 (×2): qty 1

## 2023-03-03 NOTE — Progress Notes (Signed)
PT Cancellation Note  Patient Details Name: Sharon Michael MRN: [id number] DOB: March 18, 1946   Cancelled Treatment:    Reason Eval/Treat Not Completed: Medical issues which prohibited therapy. PT awaiting results from LE ultrasound. PT will follow up for evaluation after results are available.   Arlyss Gandy 03/03/2023, 9:06 AM

## 2023-03-03 NOTE — ED Notes (Signed)
ED TO INPATIENT HANDOFF REPORT  ED Nurse Name and Phone #: Einar Grad [phone]  S Name/Age/Gender Sharon Michael 77 y.o. female Room/Bed: 029C/029C  Code Status   Code Status: Not on file  Home/SNF/Other Home Patient oriented to: self, place, time, and situation Is this baseline? Yes   Triage Complete: Triage complete  Chief Complaint Acute renal failure superimposed on stage 3b chronic kidney disease (HCC) [N17.9, N18.32]  Triage Note Pt is coming in for chronic leg pain, states it swelling more than normal. She is coming from home. She has been unable to walk so she has been rolling around her home. She has an appointment with her foot person on Monday but felt like she could no longer take it. No chest pain, no shortness of breath.  Medic vitals   118/78 100hr 18rr 98%ra 163bgl   Allergies Allergies  Allergen Reactions   Other     Level of Care/Admitting Diagnosis ED Disposition     ED Disposition  Admit   Condition  --   Comment  Hospital Area: [facility] [[id number]]  Level of Care: Telemetry Medical [[id number]]  May admit patient to [facility] or [facility] if equivalent level of care is available:: Yes  Covid Evaluation: Asymptomatic - no recent exposure (last 10 days) testing not required  Diagnosis: Acute renal failure superimposed on stage 3b chronic kidney disease [facility]) [[id number]]  Admitting Physician: Orland Mustard [[id number]]  Attending Physician: Orland Mustard [phone]  Certification:: I certify this patient will need inpatient services for at least 2 midnights  Expected Medical Readiness: 03/06/2023          B Medical/Surgery History Past Medical History:  Diagnosis Date   Diabetes mellitus without complication (HCC)    Hyperlipidemia    Hypertension    Past Surgical History:  Procedure Laterality Date   ABDOMINAL HYSTERECTOMY     BREAST BIOPSY Right      A IV Location/Drains/Wounds Patient Lines/Drains/Airways  Status     Active Line/Drains/Airways     Name Placement date Placement time Site Days   Peripheral IV 03/03/23 20 G Right Antecubital 03/03/23  0805  Antecubital  less than 1            Intake/Output Last 24 hours  Intake/Output Summary (Last 24 hours) at 03/03/2023 1026 Last data filed at 03/03/2023 0915 Gross per 24 hour  Intake 497.81 ml  Output 500 ml  Net -2.19 ml    Labs/Imaging Results for orders placed or performed during the hospital encounter of 03/02/23 (from the past 48 hour(s))  CBC     Status: None   Collection Time: 03/02/23 11:31 PM  Result Value Ref Range   WBC 9.8 4.0 - 10.5 K/uL   RBC 4.18 3.87 - 5.11 MIL/uL   Hemoglobin 12.6 12.0 - 15.0 g/dL   HCT 30.8 65.7 - 84.6 %   MCV 90.2 80.0 - 100.0 fL   MCH 30.1 26.0 - 34.0 pg   MCHC 33.4 30.0 - 36.0 g/dL   RDW 96.2 95.2 - 84.1 %   Platelets 159 150 - 400 K/uL   nRBC 0.0 0.0 - 0.2 %    Comment: Performed at [facility], [address] [place], [place] [address]  Comprehensive metabolic panel     Status: Abnormal   Collection Time: 03/02/23 11:31 PM  Result Value Ref Range   Sodium 139 135 - 145 mmol/L   Potassium 3.3 (L) 3.5 - 5.1 mmol/L   Chloride  100 98 - 111 mmol/L   CO2 22 22 - 32 mmol/L   Glucose, Bld 153 (H) 70 - 99 mg/dL    Comment: Glucose reference range applies only to samples taken after fasting for at least 8 hours.   BUN 58 (H) 8 - 23 mg/dL   Creatinine, Ser 0.34 (H) 0.44 - 1.00 mg/dL   Calcium 9.1 8.9 - 74.2 mg/dL   Total Protein 7.5 6.5 - 8.1 g/dL   Albumin 3.2 (L) 3.5 - 5.0 g/dL   AST 39 15 - 41 U/L   ALT 29 0 - 44 U/L   Alkaline Phosphatase 63 38 - 126 U/L   Total Bilirubin 1.4 (H) 0.3 - 1.2 mg/dL   GFR, Estimated 19 (L) >60 mL/min    Comment: (NOTE) Calculated using the CKD-EPI Creatinine Equation (2021)    Anion gap 17 (H) 5 - 15    Comment: Performed at [facility], [address] [place], [place] [address]  Brain natriuretic peptide     Status: None    Collection Time: 03/02/23 11:31 PM  Result Value Ref Range   B Natriuretic Peptide 67.2 0.0 - 100.0 pg/mL    Comment: Performed at [facility], [address], [place], [place] [address]   VAS [place] LOWER EXTREMITY VENOUS (DVT) (7a-7p)  Result Date: 03/03/2023  Lower Venous DVT Study Patient Name:  Sharon Michael  Date of Exam:   03/03/2023 Medical Rec #: [id number]           Accession #:    [id number] Date of Birth: 09-30-1945            Patient Gender: F Patient Age:   49 years Exam Location:  [facility] Procedure:      VAS [place] LOWER EXTREMITY VENOUS (DVT) Referring Phys: JOSHUA LONG --------------------------------------------------------------------------------  Indications: Pain. Other Indications: Patient c/o bilateral leg been for one week with the right                    being worse than the left. Denies SOB. Risk Factors: None identified. Performing Technologist: Dondra Prader RVT, RCS  Examination Guidelines: A complete evaluation includes B-mode imaging, spectral Doppler, color Doppler, and power Doppler as needed of all accessible portions of each vessel. Bilateral testing is considered an integral part of a complete examination. Limited examinations for reoccurring indications may be performed as noted. The reflux portion of the exam is performed with the patient in reverse Trendelenburg.  +---------+---------------+---------+-----------+---------------+--------------+ RIGHT    CompressibilityPhasicitySpontaneityProperties     Thrombus Aging +---------+---------------+---------+-----------+---------------+--------------+ CFV      Full           Yes      Yes                                      +---------+---------------+---------+-----------+---------------+--------------+ SFJ      Full           Yes      Yes                                      +---------+---------------+---------+-----------+---------------+--------------+ FV Prox  Full           Yes       Yes                                      +---------+---------------+---------+-----------+---------------+--------------+  FV Mid   Full           Yes      Yes                                      +---------+---------------+---------+-----------+---------------+--------------+ FV DistalFull           Yes      Yes                                      +---------+---------------+---------+-----------+---------------+--------------+ PFV      Full           Yes      Yes                                      +---------+---------------+---------+-----------+---------------+--------------+ POP      Partial        No       No         with striationsChronic        +---------+---------------+---------+-----------+---------------+--------------+ PTV      None           No       No         retracted      Chronic        +---------+---------------+---------+-----------+---------------+--------------+ PERO     None           No       No                                       +---------+---------------+---------+-----------+---------------+--------------+   +---------+---------------+---------+-----------+----------+--------------+ LEFT     CompressibilityPhasicitySpontaneityPropertiesThrombus Aging +---------+---------------+---------+-----------+----------+--------------+ CFV      Full           Yes      Yes                                 +---------+---------------+---------+-----------+----------+--------------+ SFJ      Full           Yes      Yes                                 +---------+---------------+---------+-----------+----------+--------------+ FV Prox  Full           Yes      Yes                                 +---------+---------------+---------+-----------+----------+--------------+ FV Mid   Full           Yes      Yes                                 +---------+---------------+---------+-----------+----------+--------------+ FV  DistalFull           Yes      Yes                                 +---------+---------------+---------+-----------+----------+--------------+  PFV      Full           Yes      Yes                                 +---------+---------------+---------+-----------+----------+--------------+ POP      Full           Yes      Yes                                 +---------+---------------+---------+-----------+----------+--------------+ PTV      Full           Yes      Yes                                 +---------+---------------+---------+-----------+----------+--------------+ PERO     Full           Yes      Yes                                 +---------+---------------+---------+-----------+----------+--------------+    Findings reported to Dr. Rodena Medin at 9:15 am.  Summary: RIGHT: - There is recanalized thrombus in the right popliteal vein(s).  - Findings consistent with chronic deep vein thrombosis involving the right popliteal vein, right posterior tibial veins, and right peroneal veins.  - No cystic structure found in the popliteal fossa.  LEFT: - No evidence of deep vein thrombosis in the lower extremity. No indirect evidence of obstruction proximal to the inguinal ligament.  - No cystic structure found in the popliteal fossa.  *See table(s) above for measurements and observations. Electronically signed by Sherald Hess MD on 03/03/2023 at 9:50:46 AM.    Final    DG Foot Complete Right  Result Date: 03/03/2023 CLINICAL DATA:  Bilateral foot pain and swelling EXAM: RIGHT FOOT COMPLETE - 3 VIEW COMPARISON:  None Available. FINDINGS: Generalized soft tissue swelling especially at the dorsal foot. No opaque foreign body, fracture, erosion, or soft tissue gas. Plantar heel spur. IMPRESSION: Soft tissue swelling without acute osseous finding or opaque foreign body. Electronically Signed   By: Tiburcio Pea M.D.   On: 03/03/2023 05:44   DG Foot Complete Left  Result Date:  03/03/2023 CLINICAL DATA:  Foot pain with swelling EXAM: LEFT FOOT - COMPLETE 3 VIEW COMPARISON:  None Available. FINDINGS: There is no evidence of fracture or dislocation. No evidence of erosion. Generalized soft tissue swelling without opaque foreign body or gas. IMPRESSION: Nonspecific soft tissue swelling. No acute osseous finding or soft tissue gas. Electronically Signed   By: Tiburcio Pea M.D.   On: 03/03/2023 05:44    Pending Labs Unresulted Labs (From admission, onward)     Start     Ordered   03/03/23 1024  Urinalysis, Routine w reflex microscopic -Urine, Clean Catch  Once,   URGENT       Question:  Specimen Source  Answer:  Urine, Clean Catch   03/03/23 1023   03/03/23 1022  CK  Once,   URGENT        03/03/23 1021   03/03/23 1022  Magnesium  Once,   STAT        03/03/23 1022  Vitals/Pain Today's Vitals   03/03/23 0531 03/03/23 0558 03/03/23 0700 03/03/23 0809  BP: (!) 151/94  (!) 161/86 (!) 165/82  Pulse: 80  82 88  Resp: 16  18 19   Temp:    97.6 F (36.4 C)  TempSrc:    Oral  SpO2: 100%  98% 100%  Weight:      Height:      PainSc:  0-No pain  0-No pain    Isolation Precautions No active isolations  Medications Medications  oxyCODONE-acetaminophen (PERCOCET/ROXICET) 5-325 MG per tablet 1 tablet (has no administration in time range)  gabapentin (NEURONTIN) capsule 100 mg (100 mg Oral Given 03/03/23 0806)  sodium chloride 0.9 % bolus 500 mL (0 mLs Intravenous Stopped 03/03/23 0915)    Mobility walks with device (here for inability to ambulate-has not ambulated here)     Focused Assessments Neurovascualr   R Recommendations: See Admitting Provider Note  Report given to:   Additional Notes:

## 2023-03-03 NOTE — Progress Notes (Signed)
OT Cancellation Note  Patient Details Name: Sharon Michael MRN: [id number] DOB: Sep 11, 1945   Cancelled Treatment:    Reason Eval/Treat Not Completed: Medical issues which prohibited therapy (Newly diagnosed DVT. Will follow.)  Evern Bio 03/03/2023, 2:05 PM Berna Spare, OTR/L Acute Rehabilitation Services Office: [phone]

## 2023-03-03 NOTE — Assessment & Plan Note (Addendum)
77 year old female with history of acute on chronic leg pain, limiting her mobility to a chair and sleeping/urinating on floor for past days found to have acute on chronic kidney failure -admit to tele  -check UA and urine studies  -strict I/O -check bladder scan  -likely pre renal/intra renal with no PO intake since Tuesday and elevated CK, urine still pending  -baseline creatinine appears to be around 1.4-1.8>2.5 today  -hold nephrotoxic drugs -check CK  -given 500cc IVF bolus, will continue gentle, time limited IVF  -trend, no improvement check renal US

## 2023-03-03 NOTE — ED Provider Notes (Signed)
Patient seen after prior EDP.  Patient with evidence of AKI and apparent new diagnosis of right lower extremity DVT.  Given poor living situation and associated social issues, I suspect that patient's medical issues could best be corrected by admission/overnight observation with likely short-term rehab placement if patient is agreeable with same.  Hospitalist made aware of case.   Wynetta Fines, MD 03/03/23 1024

## 2023-03-03 NOTE — Progress Notes (Signed)
PT Cancellation Note  Patient Details Name: Sharon Michael MRN: [id number] DOB: [birth year]   Cancelled Treatment:    Reason Eval/Treat Not Completed: Medical issues which prohibited therapy. Pt with newly diagnosed DVT. PT will follow up once pt has been started on anticoagulation and is within a therapeutic range.   Arlyss Gandy 03/03/2023, 1:54 PM

## 2023-03-03 NOTE — Assessment & Plan Note (Signed)
A1C of 6.1 in march of 2024  Hold metformin and would d/c with CKD SSI and accuchecks qac/hs

## 2023-03-03 NOTE — Discharge Instructions (Signed)
Information on my medicine - ELIQUIS (apixaban)  This medication education was reviewed with me or my healthcare representative as part of my discharge preparation.   Why was Eliquis prescribed for you? Eliquis was prescribed to treat blood clots that may have been found in the veins of your legs (deep vein thrombosis) or in your lungs (pulmonary embolism) and to reduce the risk of them occurring again.  What do You need to know about Eliquis ? The starting dose is 10 mg (two 5 mg tablets) taken TWICE daily for the FIRST SEVEN (7) DAYS, then on 03/10/23  the dose is reduced to ONE 5 mg tablet taken TWICE daily.  Eliquis may be taken with or without food.   Try to take the dose about the same time in the morning and in the evening. If you have difficulty swallowing the tablet whole please discuss with your pharmacist how to take the medication safely.  Take Eliquis exactly as prescribed and DO NOT stop taking Eliquis without talking to the doctor who prescribed the medication.  Stopping may increase your risk of developing a new blood clot.  Refill your prescription before you run out.  After discharge, you should have regular check-up appointments with your healthcare provider that is prescribing your Eliquis.    What do you do if you miss a dose? If a dose of ELIQUIS is not taken at the scheduled time, take it as soon as possible on the same day and twice-daily administration should be resumed. The dose should not be doubled to make up for a missed dose.  Important Safety Information A possible side effect of Eliquis is bleeding. You should call your healthcare provider right away if you experience any of the following: Bleeding from an injury or your nose that does not stop. Unusual colored urine (red or dark brown) or unusual colored stools (red or black). Unusual bruising for unknown reasons. A serious fall or if you hit your head (even if there is no bleeding).  Some  medicines may interact with Eliquis and might increase your risk of bleeding or clotting while on Eliquis. To help avoid this, consult your healthcare provider or pharmacist prior to using any new prescription or non-prescription medications, including herbals, vitamins, non-steroidal anti-inflammatory drugs (NSAIDs) and supplements.  This website has more information on Eliquis (apixaban): http://www.eliquis.com/eliquis/home

## 2023-03-03 NOTE — Plan of Care (Signed)
CHL Tonsillectomy/Adenoidectomy, Postoperative PEDS care plan entered in error.

## 2023-03-03 NOTE — Assessment & Plan Note (Signed)
Check magnesium Likely due to poor PO intake since on floor and not eating well  Replete and trend

## 2023-03-03 NOTE — ED Provider Notes (Signed)
Emergency Department Provider Note   I have reviewed the triage vital signs and the nursing notes.   HISTORY  Chief Complaint Leg Pain   HPI Sharon Michael is a 77 y.o. female with past history of diabetes, hypertension, hyperlipidemia presents to the emergency department with bilateral foot pain with some right leg swelling.  She has had chronic pain in the knees and feet for some time.  She follows with podiatry regularly as well as orthopedics intermittently for knee injections.  In the past, she has been able to ambulate with a cane and manage at home with some help from family who live nearby.  She states that for the last 4 to 5 days her bilateral foot pain has worsened to the point where she is unable to walk.  She has been sleeping on a pallet on the floor.  She is unable to get up and go to the bathroom and so she has been using old towels on the floor and then throwing them away.  She tells me that family bring her food when they are able to but her pain and situation got to the point this evening that she called EMS for ED transportation.  She does not take pain medications at home.  She has no known diagnosis of neuropathy.  She denies any falls or injuries.  No fevers.  No rash. She tells me that she is unwilling to consider even temporary rehab placement and has plans to live with her sister-in-law although admits that no real plans have been made to stay with her.   Past Medical History:  Diagnosis Date   Diabetes mellitus without complication (HCC)    Hyperlipidemia    Hypertension     Review of Systems  Constitutional: No fever/chills Cardiovascular: Denies chest pain. Respiratory: Denies shortness of breath. Gastrointestinal: No abdominal pain.  No nausea, no vomiting.  Genitourinary: Negative for dysuria. Musculoskeletal: Positive bilateral foot pain.  Skin: Negative for rash. Neurological: Negative for headaches, focal weakness or  numbness.   ____________________________________________   PHYSICAL EXAM:  VITAL SIGNS: ED Triage Vitals  Encounter Vitals Group     BP 03/02/23 2320 (!) 143/77     Pulse Rate 03/02/23 2320 95     Resp 03/02/23 2320 18     Temp 03/02/23 2320 98 F (36.7 C)     Temp Source 03/03/23 0505 Oral     SpO2 03/02/23 2320 100 %     Weight 03/03/23 0501 206 lb (93.4 kg)     Height 03/03/23 0501 5\' 5"  (1.651 m)   Constitutional: Alert and oriented. Well appearing and in no acute distress. Eyes: Conjunctivae are normal.  Head: Atraumatic. Nose: No congestion/rhinnorhea. Mouth/Throat: Mucous membranes are moist.   Neck: No stridor.   Cardiovascular: Normal rate, regular rhythm. Good peripheral circulation with 2+ DP and PT pulses bilaterally. Grossly normal heart sounds.   Respiratory: Normal respiratory effort.  No retractions. Lungs CTAB. Gastrointestinal: Soft and nontender. No distention.  Musculoskeletal: No lower extremity tenderness with trace edema bilaterally. No gross deformities of extremities. Neurologic:  Normal speech and language. No gross focal neurologic deficits are appreciated.  Skin:  Skin is warm, dry and intact. No rash noted.   ____________________________________________   LABS (all labs ordered are listed, but only abnormal results are displayed)  Labs Reviewed  COMPREHENSIVE METABOLIC PANEL - Abnormal; Notable for the following components:      Result Value   Potassium 3.3 (*)    Glucose,  Bld 153 (*)    BUN 58 (*)    Creatinine, Ser 2.51 (*)    Albumin 3.2 (*)    Total Bilirubin 1.4 (*)    GFR, Estimated 19 (*)    Anion gap 17 (*)    All other components within normal limits  CBC  BRAIN NATRIURETIC PEPTIDE   ____________________________________________  RADIOLOGY  DG Foot Complete Right  Result Date: 03/03/2023 CLINICAL DATA:  Bilateral foot pain and swelling EXAM: RIGHT FOOT COMPLETE - 3 VIEW COMPARISON:  None Available. FINDINGS:  Generalized soft tissue swelling especially at the dorsal foot. No opaque foreign body, fracture, erosion, or soft tissue gas. Plantar heel spur. IMPRESSION: Soft tissue swelling without acute osseous finding or opaque foreign body. Electronically Signed   By: Tiburcio Pea M.D.   On: 03/03/2023 05:44   DG Foot Complete Left  Result Date: 03/03/2023 CLINICAL DATA:  Foot pain with swelling EXAM: LEFT FOOT - COMPLETE 3 VIEW COMPARISON:  None Available. FINDINGS: There is no evidence of fracture or dislocation. No evidence of erosion. Generalized soft tissue swelling without opaque foreign body or gas. IMPRESSION: Nonspecific soft tissue swelling. No acute osseous finding or soft tissue gas. Electronically Signed   By: Tiburcio Pea M.D.   On: 03/03/2023 05:44    ____________________________________________   PROCEDURES  Procedure(s) performed:   Procedures  None  ____________________________________________   INITIAL IMPRESSION / ASSESSMENT AND PLAN / ED COURSE  Pertinent labs & imaging results that were available during my care of the patient were reviewed by me and considered in my medical decision making (see chart for details).   This patient is Presenting for Evaluation of foot pain, which does require a range of treatment options, and is a complaint that involves a moderate risk of morbidity and mortality.  The Differential Diagnoses include neuropathy, OA, gout, septic joint critical limb ischemia, etc.  Critical Interventions-    Medications  oxyCODONE-acetaminophen (PERCOCET/ROXICET) 5-325 MG per tablet 1 tablet (has no administration in time range)  gabapentin (NEURONTIN) capsule 100 mg (has no administration in time range)    Reassessment after intervention: pain slightly improved.   I decided to review pertinent External Data, and in summary patient follows regularly with podiatry.   Clinical Laboratory Tests Ordered, included CBC without leukocytosis or anemia.  Normal BNP. Creatinine close to recent values at 2.52 and GFR of 19.  Radiologic Tests Ordered, included bilateral foot XR. I independently interpreted the images and agree with radiology interpretation.   Social Determinants of Health Risk lives at home alone with minimal family support.  Consult complete with TOC and PT.   Medical Decision Making: Summary:  Patient presents to the ED with gradually progressive foot pain to the point where she is no longer ambulatory. No evidence on exam to suggest acute limb ischemia, infectious etiology, or acute injury. Plan for imaging and pain medication here. Patient will ultimately need TOC evaluation for rehab placement vs home health/wheelchair at home with increased family assistance. Patient is calling her family to help coordinate care at home.   Reevaluation with update and discussion with patient. Care transferred to oncoming team. PT and [facility] consults placed.   Patient's presentation is most consistent with exacerbation of chronic illness.   Disposition: pending   ____________________________________________  FINAL CLINICAL IMPRESSION(S) / ED DIAGNOSES  Final diagnoses:  Bilateral leg pain    Note:  This document was prepared using Dragon voice recognition software and may include unintentional dictation errors.  Alona Bene,  MD, [facility]    Ascencion Stegner, Arlyss Repress, MD 03/03/23 [phone]

## 2023-03-03 NOTE — Assessment & Plan Note (Signed)
Continue allopurinol 

## 2023-03-03 NOTE — Assessment & Plan Note (Addendum)
She has never been treated for the DVT that she recalls  Will start eliquis, discussed with her and agreeable. Also discussed with son risks/benefits  Lives a sedentary lifestyle, but no other known provoking factor

## 2023-03-03 NOTE — H&P (Signed)
History and Physical    Patient: Sharon Michael ZOX:[id number] DOB: 1945-08-23 DOA: 03/02/2023 DOS: the patient was seen and examined on 03/03/2023 PCP: Fleet Contras, MD  Patient coming from: Home - lives alone. Uses a walker and cane to ambulate.    Chief Complaint: leg pain   HPI: Sharon Michael is a 77 y.o. female with medical history significant of T2DM, HLD, HTN who presented to ED with complaints of leg pain.   She noticed last week her legs were swollen. She states her legs swell and usually go down. She called her doctor and they couldn't get her seen until Tuesday. She went and got her appointment time confused and she couldn't be seen. History is difficult.  She states her feet hurt and she couldn't move around much. She was in her chair and would urinate on the floor. She thinks she was in the chair for 2-3 days. She has not really been eating or drinking. This is her first meal today since Tuesday.   I talked to her son who said that her feet started to swell about one week ago and she couldn't walk. She slept in the chair and they tried to get her in the bed and she was on the floor. She stayed on the floor that  night and so he called the ambulance.   Denies any fever/chills, vision changes/headaches, chest pain or palpitations, shortness of breath or cough, abdominal pain, N/V/D, dysuria.   She does not smoke or drink alcohol.   ER Course:  vitals: afebrile, bp: 143/77, HR: 95, RR: 18, oxygen: 100%RA Pertinent labs: potassium: 3.3, BUN: 58, creatinine: 2.51 Right foot xray: soft tissue swelling without acute finding. Heel spur Left foot xray: non specific soft tissue swelling. 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 Left no DVT.  In ED: given 500cc IVF bolus, pain medicine and PT/SW consult.     Review of Systems: As mentioned in the history of  present illness. All other systems reviewed and are negative. Past Medical History:  Diagnosis Date   Diabetes mellitus without complication (HCC)    Hyperlipidemia    Hypertension    Past Surgical History:  Procedure Laterality Date   ABDOMINAL HYSTERECTOMY     BREAST BIOPSY Right    Social History:  reports that she has never smoked. She has never used smokeless tobacco. She reports that she does not drink alcohol and does not use drugs.  Allergies  Allergen Reactions   Other     Family History  Problem Relation Age of Onset   Kidney disease Mother    Hypertension Mother    Diabetes Mother    Heart disease Father    Hypertension Father    Diabetes Father    Kidney disease Sister    Cancer Brother     Prior to Admission medications   Medication Sig Start Date End Date Taking? Authorizing Provider  ACCU-CHEK AVIVA PLUS test strip  06/05/19   [provider]  Accu-Chek Softclix Lancets lancets  06/10/19   [provider]  acetaminophen (TYLENOL) 500 MG tablet Take 1,000 mg by mouth every 8 (eight) hours as needed for moderate pain.    [provider]  acetaminophen-codeine (TYLENOL #3) 300-30 MG tablet Take 1-2 tablets by mouth every 6 (six) hours as needed. 02/03/21   [provider]  Alcohol Swabs (B-D SINGLE USE SWABS REGULAR)  History and Physical    Patient: Sharon Michael ZOX:[id number] DOB: 1945-08-23 DOA: 03/02/2023 DOS: the patient was seen and examined on 03/03/2023 PCP: Fleet Contras, MD  Patient coming from: Home - lives alone. Uses a walker and cane to ambulate.    Chief Complaint: leg pain   HPI: Sharon Michael is a 77 y.o. female with medical history significant of T2DM, HLD, HTN who presented to ED with complaints of leg pain.   She noticed last week her legs were swollen. She states her legs swell and usually go down. She called her doctor and they couldn't get her seen until Tuesday. She went and got her appointment time confused and she couldn't be seen. History is difficult.  She states her feet hurt and she couldn't move around much. She was in her chair and would urinate on the floor. She thinks she was in the chair for 2-3 days. She has not really been eating or drinking. This is her first meal today since Tuesday.   I talked to her son who said that her feet started to swell about one week ago and she couldn't walk. She slept in the chair and they tried to get her in the bed and she was on the floor. She stayed on the floor that  night and so he called the ambulance.   Denies any fever/chills, vision changes/headaches, chest pain or palpitations, shortness of breath or cough, abdominal pain, N/V/D, dysuria.   She does not smoke or drink alcohol.   ER Course:  vitals: afebrile, bp: 143/77, HR: 95, RR: 18, oxygen: 100%RA Pertinent labs: potassium: 3.3, BUN: 58, creatinine: 2.51 Right foot xray: soft tissue swelling without acute finding. Heel spur Left foot xray: non specific soft tissue swelling. 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 Left no DVT.  In ED: given 500cc IVF bolus, pain medicine and PT/SW consult.     Review of Systems: As mentioned in the history of  present illness. All other systems reviewed and are negative. Past Medical History:  Diagnosis Date   Diabetes mellitus without complication (HCC)    Hyperlipidemia    Hypertension    Past Surgical History:  Procedure Laterality Date   ABDOMINAL HYSTERECTOMY     BREAST BIOPSY Right    Social History:  reports that she has never smoked. She has never used smokeless tobacco. She reports that she does not drink alcohol and does not use drugs.  Allergies  Allergen Reactions   Other     Family History  Problem Relation Age of Onset   Kidney disease Mother    Hypertension Mother    Diabetes Mother    Heart disease Father    Hypertension Father    Diabetes Father    Kidney disease Sister    Cancer Brother     Prior to Admission medications   Medication Sig Start Date End Date Taking? Authorizing Provider  ACCU-CHEK AVIVA PLUS test strip  06/05/19   [provider]  Accu-Chek Softclix Lancets lancets  06/10/19   [provider]  acetaminophen (TYLENOL) 500 MG tablet Take 1,000 mg by mouth every 8 (eight) hours as needed for moderate pain.    [provider]  acetaminophen-codeine (TYLENOL #3) 300-30 MG tablet Take 1-2 tablets by mouth every 6 (six) hours as needed. 02/03/21   [provider]  Alcohol Swabs (B-D SINGLE USE SWABS REGULAR)  Skin:  no rash or induration seen on limited exam Musculoskeletal:  grossly normal tone BUE/BLE, good ROM, no bony abnormality Lower extremity:   Limited foot exam with no ulcerations.  2+ distal pulses. Psychiatric:  grossly normal mood and affect, speech fluent and appropriate, AOx3 Neurologic:  CN 2-12 grossly intact, moves all extremities in coordinated fashion, sensation intact   Radiological Exams on Admission: Independently reviewed - see discussion in A/P where applicable  VAS [place] LOWER EXTREMITY VENOUS (DVT) (7a-7p)  Result Date: 03/03/2023  Lower Venous DVT Study Patient Name:  Avira LEE Dilworth  Date of Exam:   03/03/2023 Medical Rec #: [id number]           Accession #:    [id number] Date of Birth: 04/25/1946            Patient Gender: F Patient Age:   19 years Exam Location:  [facility] Procedure:      VAS [place] LOWER EXTREMITY VENOUS (DVT) Referring Phys: JOSHUA LONG --------------------------------------------------------------------------------  Indications: Pain. Other Indications: Patient c/o bilateral leg been for one week with the right                    being worse than the left. Denies SOB. Risk Factors: None identified. Performing Technologist: Dondra Prader RVT, RCS  Examination Guidelines: A complete evaluation includes B-mode imaging, spectral  Doppler, color Doppler, and power Doppler as needed of all accessible portions of each vessel. Bilateral testing is considered an integral part of a complete examination. Limited examinations for reoccurring indications may be performed as noted. The reflux portion of the exam is performed with the patient in reverse Trendelenburg.  +---------+---------------+---------+-----------+---------------+--------------+ RIGHT    CompressibilityPhasicitySpontaneityProperties     Thrombus Aging +---------+---------------+---------+-----------+---------------+--------------+ CFV      Full           Yes      Yes                                      +---------+---------------+---------+-----------+---------------+--------------+ SFJ      Full           Yes      Yes                                      +---------+---------------+---------+-----------+---------------+--------------+ FV Prox  Full           Yes      Yes                                      +---------+---------------+---------+-----------+---------------+--------------+ FV Mid   Full           Yes      Yes                                      +---------+---------------+---------+-----------+---------------+--------------+ FV DistalFull           Yes      Yes                                      +---------+---------------+---------+-----------+---------------+--------------+  Skin:  no rash or induration seen on limited exam Musculoskeletal:  grossly normal tone BUE/BLE, good ROM, no bony abnormality Lower extremity:   Limited foot exam with no ulcerations.  2+ distal pulses. Psychiatric:  grossly normal mood and affect, speech fluent and appropriate, AOx3 Neurologic:  CN 2-12 grossly intact, moves all extremities in coordinated fashion, sensation intact   Radiological Exams on Admission: Independently reviewed - see discussion in A/P where applicable  VAS [place] LOWER EXTREMITY VENOUS (DVT) (7a-7p)  Result Date: 03/03/2023  Lower Venous DVT Study Patient Name:  Avira LEE Dilworth  Date of Exam:   03/03/2023 Medical Rec #: [id number]           Accession #:    [id number] Date of Birth: 04/25/1946            Patient Gender: F Patient Age:   19 years Exam Location:  [facility] Procedure:      VAS [place] LOWER EXTREMITY VENOUS (DVT) Referring Phys: JOSHUA LONG --------------------------------------------------------------------------------  Indications: Pain. Other Indications: Patient c/o bilateral leg been for one week with the right                    being worse than the left. Denies SOB. Risk Factors: None identified. Performing Technologist: Dondra Prader RVT, RCS  Examination Guidelines: A complete evaluation includes B-mode imaging, spectral  Doppler, color Doppler, and power Doppler as needed of all accessible portions of each vessel. Bilateral testing is considered an integral part of a complete examination. Limited examinations for reoccurring indications may be performed as noted. The reflux portion of the exam is performed with the patient in reverse Trendelenburg.  +---------+---------------+---------+-----------+---------------+--------------+ RIGHT    CompressibilityPhasicitySpontaneityProperties     Thrombus Aging +---------+---------------+---------+-----------+---------------+--------------+ CFV      Full           Yes      Yes                                      +---------+---------------+---------+-----------+---------------+--------------+ SFJ      Full           Yes      Yes                                      +---------+---------------+---------+-----------+---------------+--------------+ FV Prox  Full           Yes      Yes                                      +---------+---------------+---------+-----------+---------------+--------------+ FV Mid   Full           Yes      Yes                                      +---------+---------------+---------+-----------+---------------+--------------+ FV DistalFull           Yes      Yes                                      +---------+---------------+---------+-----------+---------------+--------------+  Skin:  no rash or induration seen on limited exam Musculoskeletal:  grossly normal tone BUE/BLE, good ROM, no bony abnormality Lower extremity:   Limited foot exam with no ulcerations.  2+ distal pulses. Psychiatric:  grossly normal mood and affect, speech fluent and appropriate, AOx3 Neurologic:  CN 2-12 grossly intact, moves all extremities in coordinated fashion, sensation intact   Radiological Exams on Admission: Independently reviewed - see discussion in A/P where applicable  VAS [place] LOWER EXTREMITY VENOUS (DVT) (7a-7p)  Result Date: 03/03/2023  Lower Venous DVT Study Patient Name:  Avira LEE Dilworth  Date of Exam:   03/03/2023 Medical Rec #: [id number]           Accession #:    [id number] Date of Birth: 04/25/1946            Patient Gender: F Patient Age:   19 years Exam Location:  [facility] Procedure:      VAS [place] LOWER EXTREMITY VENOUS (DVT) Referring Phys: JOSHUA LONG --------------------------------------------------------------------------------  Indications: Pain. Other Indications: Patient c/o bilateral leg been for one week with the right                    being worse than the left. Denies SOB. Risk Factors: None identified. Performing Technologist: Dondra Prader RVT, RCS  Examination Guidelines: A complete evaluation includes B-mode imaging, spectral  Doppler, color Doppler, and power Doppler as needed of all accessible portions of each vessel. Bilateral testing is considered an integral part of a complete examination. Limited examinations for reoccurring indications may be performed as noted. The reflux portion of the exam is performed with the patient in reverse Trendelenburg.  +---------+---------------+---------+-----------+---------------+--------------+ RIGHT    CompressibilityPhasicitySpontaneityProperties     Thrombus Aging +---------+---------------+---------+-----------+---------------+--------------+ CFV      Full           Yes      Yes                                      +---------+---------------+---------+-----------+---------------+--------------+ SFJ      Full           Yes      Yes                                      +---------+---------------+---------+-----------+---------------+--------------+ FV Prox  Full           Yes      Yes                                      +---------+---------------+---------+-----------+---------------+--------------+ FV Mid   Full           Yes      Yes                                      +---------+---------------+---------+-----------+---------------+--------------+ FV DistalFull           Yes      Yes                                      +---------+---------------+---------+-----------+---------------+--------------+  History and Physical    Patient: Sharon Michael ZOX:[id number] DOB: 1945-08-23 DOA: 03/02/2023 DOS: the patient was seen and examined on 03/03/2023 PCP: Fleet Contras, MD  Patient coming from: Home - lives alone. Uses a walker and cane to ambulate.    Chief Complaint: leg pain   HPI: Sharon Michael is a 77 y.o. female with medical history significant of T2DM, HLD, HTN who presented to ED with complaints of leg pain.   She noticed last week her legs were swollen. She states her legs swell and usually go down. She called her doctor and they couldn't get her seen until Tuesday. She went and got her appointment time confused and she couldn't be seen. History is difficult.  She states her feet hurt and she couldn't move around much. She was in her chair and would urinate on the floor. She thinks she was in the chair for 2-3 days. She has not really been eating or drinking. This is her first meal today since Tuesday.   I talked to her son who said that her feet started to swell about one week ago and she couldn't walk. She slept in the chair and they tried to get her in the bed and she was on the floor. She stayed on the floor that  night and so he called the ambulance.   Denies any fever/chills, vision changes/headaches, chest pain or palpitations, shortness of breath or cough, abdominal pain, N/V/D, dysuria.   She does not smoke or drink alcohol.   ER Course:  vitals: afebrile, bp: 143/77, HR: 95, RR: 18, oxygen: 100%RA Pertinent labs: potassium: 3.3, BUN: 58, creatinine: 2.51 Right foot xray: soft tissue swelling without acute finding. Heel spur Left foot xray: non specific soft tissue swelling. 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 Left no DVT.  In ED: given 500cc IVF bolus, pain medicine and PT/SW consult.     Review of Systems: As mentioned in the history of  present illness. All other systems reviewed and are negative. Past Medical History:  Diagnosis Date   Diabetes mellitus without complication (HCC)    Hyperlipidemia    Hypertension    Past Surgical History:  Procedure Laterality Date   ABDOMINAL HYSTERECTOMY     BREAST BIOPSY Right    Social History:  reports that she has never smoked. She has never used smokeless tobacco. She reports that she does not drink alcohol and does not use drugs.  Allergies  Allergen Reactions   Other     Family History  Problem Relation Age of Onset   Kidney disease Mother    Hypertension Mother    Diabetes Mother    Heart disease Father    Hypertension Father    Diabetes Father    Kidney disease Sister    Cancer Brother     Prior to Admission medications   Medication Sig Start Date End Date Taking? Authorizing Provider  ACCU-CHEK AVIVA PLUS test strip  06/05/19   [provider]  Accu-Chek Softclix Lancets lancets  06/10/19   [provider]  acetaminophen (TYLENOL) 500 MG tablet Take 1,000 mg by mouth every 8 (eight) hours as needed for moderate pain.    [provider]  acetaminophen-codeine (TYLENOL #3) 300-30 MG tablet Take 1-2 tablets by mouth every 6 (six) hours as needed. 02/03/21   [provider]  Alcohol Swabs (B-D SINGLE USE SWABS REGULAR)

## 2023-03-03 NOTE — Progress Notes (Signed)
*  PRELIMINARY RESULTS* Vascular Ultrasound Bilateral lower venous has been completed.  Preliminary findings: Chronic DVT in the right popliteal, posterior tibial and peroneal veins  Sharon Michael 03/03/2023, 9:17 AM

## 2023-03-03 NOTE — Assessment & Plan Note (Signed)
On norvasc 10mg , Toprol xl 100mg  and micardis 80-12.5mg  Hold micardis with AKI Blood pressure has been well controlled, continue toprol, hold norvasc for now

## 2023-03-03 NOTE — Progress Notes (Signed)
PHARMACY - ANTICOAGULATION CONSULT NOTE  Pharmacy Consult for Apixaban Indication: DVT  Allergies  Allergen Reactions   Other     Patient Measurements: Height: 5\' 5"  (165.1 cm) Weight: 93.4 kg (206 lb) IBW/kg (Calculated) : 57  Vital Signs: Temp: 97.8 F (36.6 C) (10/18 1127) Temp Source: Oral (10/18 1127) BP: 127/71 (10/18 1127) Pulse Rate: 86 (10/18 1127)  Labs: Recent Labs    03/02/23 2331 03/03/23 1216  HGB 12.6  --   HCT 37.7  --   PLT 159  --   CREATININE 2.51*  --   CKTOTAL  --  1,006*    Estimated Creatinine Clearance: 20.9 mL/min (A) (by C-G formula based on SCr of 2.51 mg/dL (H)).   Medical History: Past Medical History:  Diagnosis Date   Diabetes mellitus without complication (HCC)    Hyperlipidemia    Hypertension     Assessment: 77 YO female not on anticoagulation PTA who presented with complaints for leg pain. Doppler with chronic deep vein thrombosis involving the right popliteal vein, right posterior tibial veins, and right peroneal veins.   Goal of Therapy:  Monitor platelets by anticoagulation protocol: Yes   Plan:  Apixaban 10mg  twice daily x7 days followed by apixaban 5mg  twice daily thereafter Continue to monitor H&H and platelets    Of note, copay $47    Thank you for allowing pharmacy to be a part of this patient's care.  Thelma Barge, PharmD Clinical Pharmacist

## 2023-03-04 DIAGNOSIS — E1140 Type 2 diabetes mellitus with diabetic neuropathy, unspecified: Secondary | ICD-10-CM | POA: Diagnosis not present

## 2023-03-04 DIAGNOSIS — I825Z9 Chronic embolism and thrombosis of unspecified deep veins of unspecified distal lower extremity: Secondary | ICD-10-CM | POA: Diagnosis not present

## 2023-03-04 DIAGNOSIS — E876 Hypokalemia: Secondary | ICD-10-CM | POA: Diagnosis not present

## 2023-03-04 DIAGNOSIS — N170 Acute kidney failure with tubular necrosis: Secondary | ICD-10-CM | POA: Diagnosis not present

## 2023-03-04 DIAGNOSIS — M79604 Pain in right leg: Secondary | ICD-10-CM | POA: Diagnosis not present

## 2023-03-04 DIAGNOSIS — I10 Essential (primary) hypertension: Secondary | ICD-10-CM

## 2023-03-04 DIAGNOSIS — N179 Acute kidney failure, unspecified: Secondary | ICD-10-CM | POA: Diagnosis not present

## 2023-03-04 LAB — CBC
HCT: 37.9 % (ref 36.0–46.0)
Hemoglobin: 12.2 g/dL (ref 12.0–15.0)
MCH: 29.4 pg (ref 26.0–34.0)
MCHC: 32.2 g/dL (ref 30.0–36.0)
MCV: 91.3 fL (ref 80.0–100.0)
Platelets: 122 10*3/uL — ABNORMAL LOW (ref 150–400)
RBC: 4.15 MIL/uL (ref 3.87–5.11)
RDW: 15 % (ref 11.5–15.5)
WBC: 8.6 10*3/uL (ref 4.0–10.5)
nRBC: 0 % (ref 0.0–0.2)

## 2023-03-04 LAB — COMPREHENSIVE METABOLIC PANEL
ALT: 39 U/L (ref 0–44)
AST: 62 U/L — ABNORMAL HIGH (ref 15–41)
Albumin: 2.7 g/dL — ABNORMAL LOW (ref 3.5–5.0)
Alkaline Phosphatase: 58 U/L (ref 38–126)
Anion gap: 11 (ref 5–15)
BUN: 52 mg/dL — ABNORMAL HIGH (ref 8–23)
CO2: 22 mmol/L (ref 22–32)
Calcium: 8.7 mg/dL — ABNORMAL LOW (ref 8.9–10.3)
Chloride: 106 mmol/L (ref 98–111)
Creatinine, Ser: 1.57 mg/dL — ABNORMAL HIGH (ref 0.44–1.00)
GFR, Estimated: 34 mL/min — ABNORMAL LOW (ref >60)
Glucose, Bld: 107 mg/dL — ABNORMAL HIGH (ref 70–99)
Potassium: 3.5 mmol/L (ref 3.5–5.1)
Sodium: 139 mmol/L (ref 135–145)
Total Bilirubin: 0.9 mg/dL (ref 0.3–1.2)
Total Protein: 6.6 g/dL (ref 6.5–8.1)

## 2023-03-04 LAB — GLUCOSE, CAPILLARY
Glucose-Capillary: 112 mg/dL — ABNORMAL HIGH (ref 70–99)
Glucose-Capillary: 124 mg/dL — ABNORMAL HIGH (ref 70–99)
Glucose-Capillary: 124 mg/dL — ABNORMAL HIGH (ref 70–99)
Glucose-Capillary: 152 mg/dL — ABNORMAL HIGH (ref 70–99)

## 2023-03-04 LAB — CK: Total CK: 632 U/L — ABNORMAL HIGH (ref 38–234)

## 2023-03-04 NOTE — Plan of Care (Signed)
  Problem: Health Behavior/Discharge Planning: Goal: Ability to identify and utilize available resources and services will improve Outcome: Progressing Goal: Ability to manage health-related needs will improve Outcome: Progressing   Problem: Metabolic: Goal: Ability to maintain appropriate glucose levels will improve Outcome: Progressing   Problem: Nutritional: Goal: Maintenance of adequate nutrition will improve Outcome: Progressing Goal: Progress toward achieving an optimal weight will improve Outcome: Progressing   Problem: Skin Integrity: Goal: Risk for impaired skin integrity will decrease Outcome: Progressing   Problem: Tissue Perfusion: Goal: Adequacy of tissue perfusion will improve Outcome: Progressing   Problem: Education: Goal: Knowledge of General Education information will improve Description: Including pain rating scale, medication(s)/side effects and non-pharmacologic comfort measures Outcome: Progressing   Problem: Health Behavior/Discharge Planning: Goal: Ability to manage health-related needs will improve Outcome: Progressing   Problem: Clinical Measurements: Goal: Ability to maintain clinical measurements within normal limits will improve Outcome: Progressing Goal: Will remain free from infection Outcome: Progressing Goal: Diagnostic test results will improve Outcome: Progressing Goal: Respiratory complications will improve Outcome: Progressing Goal: Cardiovascular complication will be avoided Outcome: Progressing   Problem: Activity: Goal: Risk for activity intolerance will decrease Outcome: Progressing   Problem: Nutrition: Goal: Adequate nutrition will be maintained Outcome: Progressing   Problem: Coping: Goal: Level of anxiety will decrease Outcome: Progressing   Problem: Elimination: Goal: Will not experience complications related to bowel motility Outcome: Progressing Goal: Will not experience complications related to urinary  retention Outcome: Progressing   Problem: Pain Managment: Goal: General experience of comfort will improve Outcome: Progressing   Problem: Safety: Goal: Ability to remain free from injury will improve Outcome: Progressing   Problem: Skin Integrity: Goal: Risk for impaired skin integrity will decrease Outcome: Progressing

## 2023-03-04 NOTE — Evaluation (Signed)
Physical Therapy Evaluation Patient Details Name: Sharon Michael MRN: [id number] DOB: 05-Dec-1945 Today's Date: 03/04/2023  History of Present Illness  77 y.o. female presents to [facility] on 03/02/2023 with bilateral foot pain and RLE swelling, unable to ambulate due to symptoms. PMH includes DM, HTN, HLD.  Clinical Impression  Patient presents with dependencies in transfers and gait, limited by decreased balance, generalized weakness and increased pain bilateral feet.  Patient was fairly independent prior to admission and feel can ultimately return home.  Recommend intensive inpatient post-acute therapy >3 hours/day  to maximize potential. PT will follow while in hospital.      If plan is discharge home, recommend the following: A lot of help with walking and/or transfers;A lot of help with bathing/dressing/bathroom;Assistance with cooking/housework;Help with stairs or ramp for entrance;Assist for transportation   Can travel by private vehicle        Equipment Recommendations None recommended by PT  Recommendations for Other Services  Rehab consult    Functional Status Assessment Patient has had a recent decline in their functional status and demonstrates the ability to make significant improvements in function in a reasonable and predictable amount of time.     Precautions / Restrictions Precautions Precautions: Fall      Mobility  Bed Mobility Overal bed mobility: Needs Assistance Bed Mobility: Supine to Sit, Sit to Supine     Supine to sit: Min assist Sit to supine: Min assist        Transfers                   General transfer comment: unable to transfer to standing even with +2 assistance due to pain in feet.    Ambulation/Gait                  Stairs            Wheelchair Mobility     Tilt Bed    Modified Rankin (Stroke Patients Only)       Balance Overall balance assessment: Needs assistance Sitting-balance support: No  upper extremity supported, Feet supported Sitting balance-Leahy Scale: Fair                                       Pertinent Vitals/Pain Pain Assessment Pain Assessment: 0-10 Pain Score: 7  Pain Descriptors / Indicators: Discomfort, Tender Pain Intervention(s): Limited activity within patient's tolerance, Monitored during session    Home Living Family/patient expects to be discharged to:: Private residence Living Arrangements: Alone Available Help at Discharge: Family;Available PRN/intermittently Type of Home: House Home Access: Stairs to enter Entrance Stairs-Rails: Right Entrance Stairs-Number of Steps: 2   Home Layout: One level Home Equipment: Cane - single point      Prior Function Prior Level of Function : Independent/Modified Independent                     Extremity/Trunk Assessment   Upper Extremity Assessment Upper Extremity Assessment: Defer to OT evaluation    Lower Extremity Assessment Lower Extremity Assessment: Generalized weakness    Cervical / Trunk Assessment Cervical / Trunk Assessment: Kyphotic  Communication   Communication Communication: No apparent difficulties  Cognition Arousal: Alert Behavior During Therapy: WFL for tasks assessed/performed Overall Cognitive Status: Within Functional Limits for tasks assessed  General Comments      Exercises     Assessment/Plan    PT Assessment Patient needs continued PT services  PT Problem List Decreased strength;Decreased range of motion;Decreased activity tolerance;Decreased balance;Decreased mobility;Pain       PT Treatment Interventions DME instruction;Gait training;Stair training;Functional mobility training;Therapeutic activities;Therapeutic exercise;Balance training;Patient/family education    PT Goals (Current goals can be found in the Care Plan section)  Acute Rehab PT Goals Patient Stated Goal: get  better and get home PT Goal Formulation: With patient Time For Goal Achievement: 03/18/23 Potential to Achieve Goals: Good    Frequency Min 1X/week     Co-evaluation               AM-PAC PT "6 Clicks" Mobility  Outcome Measure Help needed turning from your back to your side while in a flat bed without using bedrails?: A Little Help needed moving from lying on your back to sitting on the side of a flat bed without using bedrails?: A Little Help needed moving to and from a bed to a chair (including a wheelchair)?: Total Help needed standing up from a chair using your arms (e.g., wheelchair or bedside chair)?: Total Help needed to walk in hospital room?: Total Help needed climbing 3-5 steps with a railing? : Total 6 Click Score: 10    End of Session   Activity Tolerance: Patient limited by pain Patient left: in bed;with call bell/phone within reach   PT Visit Diagnosis: Other abnormalities of gait and mobility (R26.89);Muscle weakness (generalized) (M62.81);Difficulty in walking, not elsewhere classified (R26.2);Pain Pain - Right/Left: Left Pain - part of body: Ankle and joints of foot    Time: 1415-1440 PT Time Calculation (min) (ACUTE ONLY): 25 min   Charges:   PT Evaluation $PT Eval Moderate Complexity: 1 Mod PT Treatments $Therapeutic Activity: 8-22 mins PT General Charges $$ ACUTE PT VISIT: 1 Visit         03/04/2023 Delray Alt, PT Acute Rehabilitation Services Office:  [phone]   Olivia Canter 03/04/2023, 3:31 PM

## 2023-03-04 NOTE — Plan of Care (Signed)
  Problem: Metabolic: Goal: Ability to maintain appropriate glucose levels will improve Outcome: Progressing   Problem: Nutritional: Goal: Maintenance of adequate nutrition will improve Outcome: Progressing   Problem: Skin Integrity: Goal: Risk for impaired skin integrity will decrease Outcome: Progressing   Problem: Clinical Measurements: Goal: Will remain free from infection Outcome: Progressing Goal: Respiratory complications will improve Outcome: Progressing Goal: Cardiovascular complication will be avoided Outcome: Progressing   Problem: Nutrition: Goal: Adequate nutrition will be maintained Outcome: Progressing   Problem: Coping: Goal: Level of anxiety will decrease Outcome: Progressing   Problem: Elimination: Goal: Will not experience complications related to bowel motility Outcome: Progressing

## 2023-03-04 NOTE — Progress Notes (Signed)
[facility]                                                                               Sharon Michael, is a 77 y.o. female, DOB - [birth year], [id number] Admit date - 03/02/2023    Outpatient Primary MD for the patient is Fleet Contras, MD  LOS - 1  days    Brief summary    Sharon Michael is a 77 y.o. female with medical history significant of T2DM, HLD, HTN who presented to ED with complaints of leg pain.  Right foot xray: soft tissue swelling without acute finding. Heel spur Left foot xray: non specific soft tissue swelling. 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   Assessment & Plan    Assessment and Plan: * Acute renal failure superimposed on stage 3b chronic kidney disease (HCC) Probably secondary to dehydration. IV fluids on admission and repeat creatinine improved from 2.5 to 1.57.   Probably pre renal/intra renal with no PO intake since Tuesday and elevated CK,  -baseline creatinine appears to be around 1.4-1.8 -hold nephrotoxic drugs.   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  Lives a sedentary lifestyle, but no other known provoking factor   Hypokalemia Replaced. Repeat level wnl.   Type 2 diabetes, controlled, with neuropathy (HCC) A1C of 6.1 in march of 2024  Hold metformin. CBG (last 3)  Recent Labs    03/04/23 0916 03/04/23 1127 03/04/23 1634  GLUCAP 112* 124* 124*   Resume SSI. Metformin on hold for CKD.   Benign essential HTN Well controlled.   Gout Continue allopurinol          Estimated body mass index is 34.28 kg/m as calculated from the following:   Height as of this encounter: 5\' 5"  (1.651 m).   Weight as of this encounter: 93.4 kg.  Code Status: full code.  DVT Prophylaxis:    apixaban (ELIQUIS) tablet 10 mg  apixaban (ELIQUIS) tablet 5 mg   Level of Care: Level of care: Telemetry Medical Family Communication:none at bedside.   Disposition Plan:     Remains inpatient appropriate:SNF  Procedures:  None.   Consultants:   None.   Antimicrobials:   Anti-infectives (From admission, onward)    None        Medications  Scheduled Meds:  allopurinol  100 mg Oral Daily   apixaban  10 mg Oral BID   Followed by   Melene Muller ON 03/10/2023] apixaban  5 mg Oral BID   [START ON 03/05/2023] atorvastatin  40 mg Oral Daily   gabapentin  100 mg Oral BID   insulin aspart  0-9 Units Subcutaneous TID WC   metoprolol succinate  100 mg Oral Daily   oxybutynin  5 mg Oral BID   Continuous Infusions: PRN Meds:.acetaminophen **OR** acetaminophen, oxyCODONE-acetaminophen    Subjective:   Sharon Michael was seen and examined today.  Feeling much better, reports living by herself at home.   Objective:  [facility]                                                                               Sharon Michael, is a 77 y.o. female, DOB - [birth year], [id number] Admit date - 03/02/2023    Outpatient Primary MD for the patient is Fleet Contras, MD  LOS - 1  days    Brief summary    Sharon Michael is a 77 y.o. female with medical history significant of T2DM, HLD, HTN who presented to ED with complaints of leg pain.  Right foot xray: soft tissue swelling without acute finding. Heel spur Left foot xray: non specific soft tissue swelling. 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   Assessment & Plan    Assessment and Plan: * Acute renal failure superimposed on stage 3b chronic kidney disease (HCC) Probably secondary to dehydration. IV fluids on admission and repeat creatinine improved from 2.5 to 1.57.   Probably pre renal/intra renal with no PO intake since Tuesday and elevated CK,  -baseline creatinine appears to be around 1.4-1.8 -hold nephrotoxic drugs.   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  Lives a sedentary lifestyle, but no other known provoking factor   Hypokalemia Replaced. Repeat level wnl.   Type 2 diabetes, controlled, with neuropathy (HCC) A1C of 6.1 in march of 2024  Hold metformin. CBG (last 3)  Recent Labs    03/04/23 0916 03/04/23 1127 03/04/23 1634  GLUCAP 112* 124* 124*   Resume SSI. Metformin on hold for CKD.   Benign essential HTN Well controlled.   Gout Continue allopurinol          Estimated body mass index is 34.28 kg/m as calculated from the following:   Height as of this encounter: 5\' 5"  (1.651 m).   Weight as of this encounter: 93.4 kg.  Code Status: full code.  DVT Prophylaxis:    apixaban (ELIQUIS) tablet 10 mg  apixaban (ELIQUIS) tablet 5 mg   Level of Care: Level of care: Telemetry Medical Family Communication:none at bedside.   Disposition Plan:     Remains inpatient appropriate:SNF  Procedures:  None.   Consultants:   None.   Antimicrobials:   Anti-infectives (From admission, onward)    None        Medications  Scheduled Meds:  allopurinol  100 mg Oral Daily   apixaban  10 mg Oral BID   Followed by   Melene Muller ON 03/10/2023] apixaban  5 mg Oral BID   [START ON 03/05/2023] atorvastatin  40 mg Oral Daily   gabapentin  100 mg Oral BID   insulin aspart  0-9 Units Subcutaneous TID WC   metoprolol succinate  100 mg Oral Daily   oxybutynin  5 mg Oral BID   Continuous Infusions: PRN Meds:.acetaminophen **OR** acetaminophen, oxyCODONE-acetaminophen    Subjective:   Sharon Michael was seen and examined today.  Feeling much better, reports living by herself at home.   Objective:  [facility]                                                                               Sharon Michael, is a 77 y.o. female, DOB - [birth year], [id number] Admit date - 03/02/2023    Outpatient Primary MD for the patient is Fleet Contras, MD  LOS - 1  days    Brief summary    Sharon Michael is a 77 y.o. female with medical history significant of T2DM, HLD, HTN who presented to ED with complaints of leg pain.  Right foot xray: soft tissue swelling without acute finding. Heel spur Left foot xray: non specific soft tissue swelling. 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   Assessment & Plan    Assessment and Plan: * Acute renal failure superimposed on stage 3b chronic kidney disease (HCC) Probably secondary to dehydration. IV fluids on admission and repeat creatinine improved from 2.5 to 1.57.   Probably pre renal/intra renal with no PO intake since Tuesday and elevated CK,  -baseline creatinine appears to be around 1.4-1.8 -hold nephrotoxic drugs.   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  Lives a sedentary lifestyle, but no other known provoking factor   Hypokalemia Replaced. Repeat level wnl.   Type 2 diabetes, controlled, with neuropathy (HCC) A1C of 6.1 in march of 2024  Hold metformin. CBG (last 3)  Recent Labs    03/04/23 0916 03/04/23 1127 03/04/23 1634  GLUCAP 112* 124* 124*   Resume SSI. Metformin on hold for CKD.   Benign essential HTN Well controlled.   Gout Continue allopurinol          Estimated body mass index is 34.28 kg/m as calculated from the following:   Height as of this encounter: 5\' 5"  (1.651 m).   Weight as of this encounter: 93.4 kg.  Code Status: full code.  DVT Prophylaxis:    apixaban (ELIQUIS) tablet 10 mg  apixaban (ELIQUIS) tablet 5 mg   Level of Care: Level of care: Telemetry Medical Family Communication:none at bedside.   Disposition Plan:     Remains inpatient appropriate:SNF  Procedures:  None.   Consultants:   None.   Antimicrobials:   Anti-infectives (From admission, onward)    None        Medications  Scheduled Meds:  allopurinol  100 mg Oral Daily   apixaban  10 mg Oral BID   Followed by   Melene Muller ON 03/10/2023] apixaban  5 mg Oral BID   [START ON 03/05/2023] atorvastatin  40 mg Oral Daily   gabapentin  100 mg Oral BID   insulin aspart  0-9 Units Subcutaneous TID WC   metoprolol succinate  100 mg Oral Daily   oxybutynin  5 mg Oral BID   Continuous Infusions: PRN Meds:.acetaminophen **OR** acetaminophen, oxyCODONE-acetaminophen    Subjective:   Sharon Michael was seen and examined today.  Feeling much better, reports living by herself at home.   Objective:  [facility]                                                                               Sharon Michael, is a 77 y.o. female, DOB - [birth year], [id number] Admit date - 03/02/2023    Outpatient Primary MD for the patient is Fleet Contras, MD  LOS - 1  days    Brief summary    Sharon Michael is a 77 y.o. female with medical history significant of T2DM, HLD, HTN who presented to ED with complaints of leg pain.  Right foot xray: soft tissue swelling without acute finding. Heel spur Left foot xray: non specific soft tissue swelling. 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   Assessment & Plan    Assessment and Plan: * Acute renal failure superimposed on stage 3b chronic kidney disease (HCC) Probably secondary to dehydration. IV fluids on admission and repeat creatinine improved from 2.5 to 1.57.   Probably pre renal/intra renal with no PO intake since Tuesday and elevated CK,  -baseline creatinine appears to be around 1.4-1.8 -hold nephrotoxic drugs.   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  Lives a sedentary lifestyle, but no other known provoking factor   Hypokalemia Replaced. Repeat level wnl.   Type 2 diabetes, controlled, with neuropathy (HCC) A1C of 6.1 in march of 2024  Hold metformin. CBG (last 3)  Recent Labs    03/04/23 0916 03/04/23 1127 03/04/23 1634  GLUCAP 112* 124* 124*   Resume SSI. Metformin on hold for CKD.   Benign essential HTN Well controlled.   Gout Continue allopurinol          Estimated body mass index is 34.28 kg/m as calculated from the following:   Height as of this encounter: 5\' 5"  (1.651 m).   Weight as of this encounter: 93.4 kg.  Code Status: full code.  DVT Prophylaxis:    apixaban (ELIQUIS) tablet 10 mg  apixaban (ELIQUIS) tablet 5 mg   Level of Care: Level of care: Telemetry Medical Family Communication:none at bedside.   Disposition Plan:     Remains inpatient appropriate:SNF  Procedures:  None.   Consultants:   None.   Antimicrobials:   Anti-infectives (From admission, onward)    None        Medications  Scheduled Meds:  allopurinol  100 mg Oral Daily   apixaban  10 mg Oral BID   Followed by   Melene Muller ON 03/10/2023] apixaban  5 mg Oral BID   [START ON 03/05/2023] atorvastatin  40 mg Oral Daily   gabapentin  100 mg Oral BID   insulin aspart  0-9 Units Subcutaneous TID WC   metoprolol succinate  100 mg Oral Daily   oxybutynin  5 mg Oral BID   Continuous Infusions: PRN Meds:.acetaminophen **OR** acetaminophen, oxyCODONE-acetaminophen    Subjective:   Sharon Michael was seen and examined today.  Feeling much better, reports living by herself at home.   Objective:  Vitals:   03/03/23 2157 03/04/23 0501 03/04/23 0811 03/04/23 1635  BP: 108/68 (!) 144/81 99/68 120/71  Pulse: 83 72 76 68  Resp: 16 16 20 18   Temp: 99.2 F (37.3 C) 99 F (37.2 C) 98.6 F (37 C) 99.8 F (37.7 C)  TempSrc:   Oral Oral  SpO2: 94% 97% 100% 100%  Weight:      Height:        Intake/Output Summary (Last 24 hours) at 03/04/2023 1718 Last data filed at 03/04/2023 0900 Gross per 24 hour  Intake 480 ml  Output 1 ml  Net 479 ml   Filed Weights   03/03/23 0501  Weight: 93.4 kg     Exam General exam: Appears calm and comfortable  Respiratory system: Clear to auscultation. Respiratory effort normal. Cardiovascular system: S1 & S2 heard, RRR. No JVD, Gastrointestinal system: Abdomen is nondistended, soft and nontender.  Central nervous system: Alert and oriented.  Extremities: leg tenderness.  Skin: No rashes, Psychiatry: mood is appropriate.    Data Reviewed:  I have personally reviewed following labs and imaging  studies   CBC Lab Results  Component Value Date   WBC 8.6 03/04/2023   RBC 4.15 03/04/2023   HGB 12.2 03/04/2023   HCT 37.9 03/04/2023   MCV 91.3 03/04/2023   MCH 29.4 03/04/2023   PLT 122 (L) 03/04/2023   MCHC 32.2 03/04/2023   RDW 15.0 03/04/2023     Last metabolic panel Lab Results  Component Value Date   NA 139 03/04/2023   K 3.5 03/04/2023   CL 106 03/04/2023   CO2 22 03/04/2023   BUN 52 (H) 03/04/2023   CREATININE 1.57 (H) 03/04/2023   GLUCOSE 107 (H) 03/04/2023   GFRNONAA 34 (L) 03/04/2023   GFRAA 85 (L) 07/09/2012   CALCIUM 8.7 (L) 03/04/2023   PROT 6.6 03/04/2023   ALBUMIN 2.7 (L) 03/04/2023   BILITOT 0.9 03/04/2023   ALKPHOS 58 03/04/2023   AST 62 (H) 03/04/2023   ALT 39 03/04/2023   ANIONGAP 11 03/04/2023    CBG (last 3)  Recent Labs    03/04/23 0916 03/04/23 1127 03/04/23 1634  GLUCAP 112* 124* 124*      Coagulation Profile: No results for input(s): "INR", "PROTIME" in the last 168 hours.   Radiology Studies: VAS [place] LOWER EXTREMITY VENOUS (DVT) (7a-7p)  Result Date: 03/03/2023  Lower Venous DVT Study Patient Name:  Medina LEE Broady  Date of Exam:   03/03/2023 Medical Rec #: [id number]           Accession #:    [id number] Date of Birth: 1945/10/14            Patient Gender: F Patient Age:   54 years Exam Location:  [facility] Procedure:      VAS [place] LOWER EXTREMITY VENOUS (DVT) Referring Phys: JOSHUA LONG --------------------------------------------------------------------------------  Indications: Pain. Other Indications: Patient c/o bilateral leg been for one week with the right                    being worse than the left. Denies SOB. Risk Factors: None identified. Performing Technologist: Dondra Prader RVT, RCS  Examination Guidelines: A complete evaluation includes B-mode imaging, spectral Doppler, color Doppler, and power Doppler as needed of all accessible portions of each vessel. Bilateral testing is considered an integral part of a  complete examination. Limited examinations for reoccurring indications may be performed as noted. The reflux portion of the exam is performed with the

## 2023-03-05 DIAGNOSIS — I825Z9 Chronic embolism and thrombosis of unspecified deep veins of unspecified distal lower extremity: Secondary | ICD-10-CM | POA: Diagnosis not present

## 2023-03-05 DIAGNOSIS — E876 Hypokalemia: Secondary | ICD-10-CM | POA: Diagnosis not present

## 2023-03-05 DIAGNOSIS — N179 Acute kidney failure, unspecified: Secondary | ICD-10-CM | POA: Diagnosis not present

## 2023-03-05 DIAGNOSIS — E1140 Type 2 diabetes mellitus with diabetic neuropathy, unspecified: Secondary | ICD-10-CM | POA: Diagnosis not present

## 2023-03-05 LAB — URINE CULTURE

## 2023-03-05 LAB — GLUCOSE, CAPILLARY
Glucose-Capillary: 133 mg/dL — ABNORMAL HIGH (ref 70–99)
Glucose-Capillary: 105 mg/dL — ABNORMAL HIGH (ref 70–99)
Glucose-Capillary: 104 mg/dL — ABNORMAL HIGH (ref 70–99)
Glucose-Capillary: 174 mg/dL — ABNORMAL HIGH (ref 70–99)

## 2023-03-05 NOTE — Progress Notes (Signed)
[facility]                                                                               Sharon Michael, is a 77 y.o. female, DOB - 1945-06-24, WUJ:[id number] Admit date - 03/02/2023    Outpatient Primary MD for the patient is Sharon Contras, MD  LOS - 2  days    Brief summary    Sharon Michael is a 78 y.o. female with medical history significant of T2DM, HLD, HTN who presented to ED with complaints of leg pain.  Right foot xray: soft tissue swelling without acute finding. Heel spur Left foot xray: non specific soft tissue swelling. 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   Assessment & Plan    Assessment and Plan: * Acute renal failure superimposed on stage 3b chronic kidney disease (HCC) Probably secondary to dehydration. IV fluids on admission and repeat creatinine improved from 2.5 to 1.57.   Probably pre renal/intra renal with no PO intake since Tuesday and elevated CK,  -baseline creatinine appears to be around 1.4-1.8. recheck creatinine of 1.5 today.  -hold nephrotoxic drugs.   Mild rhabdomyolysis: Improving CK levels.  Recheck CK in am.    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  Lives a sedentary lifestyle, but no other known provoking factor  Recheck hemoglobin in am.   Hypokalemia Replaced. Repeat level wnl.   Type 2 diabetes, controlled, with neuropathy (HCC) A1C of 6.1 in march of 2024  Hold metformin. Check A1c in am.  CBG (last 3)  Recent Labs    03/05/23 0709 03/05/23 1110 03/05/23 1623  GLUCAP 133* 105* 104*   Pt refusing SSI. Does not want to be checked.  Recheck A1c in am.   Benign essential HTN Well controlled BP parameters.   Gout Continue allopurinol   Mild thrombocytopenia:   Recheck platelet count in am.     Therapy eval recommending SNF.  Pt stable for discharge.    Estimated body mass index is 34.28 kg/m as calculated from the following:   Height as of this encounter: 5\' 5"  (1.651 m).   Weight as of this encounter: 93.4 kg.  Code Status: full code.  DVT Prophylaxis:   apixaban (ELIQUIS) tablet 10 mg  apixaban (ELIQUIS) tablet 5 mg   Level of Care: Level of care: Telemetry Medical Family Communication:none at bedside.   Disposition Plan:     Remains inpatient appropriate:SNF  Procedures:  None.   Consultants:   None.   Antimicrobials:   Anti-infectives (From admission, onward)    None        Medications  Scheduled Meds:  allopurinol  100 mg Oral Daily   apixaban  10 mg Oral BID   Followed by   Melene Muller ON 03/10/2023] apixaban  5 mg Oral BID   atorvastatin  40 mg Oral Daily   gabapentin  100 mg Oral BID   insulin aspart  0-9 Units Subcutaneous TID WC  metoprolol succinate  100 mg Oral Daily   oxybutynin  5 mg Oral BID   Continuous Infusions: PRN Meds:.acetaminophen **OR** acetaminophen, oxyCODONE-acetaminophen    Subjective:   Sharon Michael was seen and examined today. No new complaints. Does not want SSI or cbg's   Objective:   Vitals:   03/04/23 2029 03/05/23 0538 03/05/23 0900 03/05/23 1622  BP: 123/73 (!) 150/106 132/88 138/84  Pulse: 74 75 80 82  Resp: 17 17 18 18   Temp: 97.7 F (36.5 C) 98.1 F (36.7 C) 98 F (36.7 C) 98.9 F (37.2 C)  TempSrc: Oral Oral Oral Oral  SpO2: 100% 100% 100% 99%  Weight:      Height:        Intake/Output Summary (Last 24 hours) at 03/05/2023 1655 Last data filed at 03/05/2023 1320 Gross per 24 hour  Intake 480 ml  Output 1150 ml  Net -670 ml   Filed Weights   03/03/23 0501  Weight: 93.4 kg     Exam General exam: Appears calm and comfortable  Respiratory system: Clear to auscultation. Respiratory effort normal. Cardiovascular system: S1 & S2 heard, RRR.   Gastrointestinal system: Abdomen is nondistended, soft and nontender.  Central nervous system: Alert and oriented. No focal neurological deficits. Extremities: Symmetric 5 x 5 power. Skin: No rashes, Psychiatry:  Mood & affect appropriate.     Data Reviewed:  I have personally reviewed following labs and imaging studies   CBC Lab Results  Component Value Date   WBC 8.6 03/04/2023   RBC 4.15 03/04/2023   HGB 12.2 03/04/2023   HCT 37.9 03/04/2023   MCV 91.3 03/04/2023   MCH 29.4 03/04/2023   PLT 122 (L) 03/04/2023   MCHC 32.2 03/04/2023   RDW 15.0 03/04/2023     Last metabolic panel Lab Results  Component Value Date   NA 139 03/04/2023   K 3.5 03/04/2023   CL 106 03/04/2023   CO2 22 03/04/2023   BUN 52 (H) 03/04/2023   CREATININE 1.57 (H) 03/04/2023   GLUCOSE 107 (H) 03/04/2023   GFRNONAA 34 (L) 03/04/2023   GFRAA 85 (L) 07/09/2012   CALCIUM 8.7 (L) 03/04/2023   PROT 6.6 03/04/2023   ALBUMIN 2.7 (L) 03/04/2023   BILITOT 0.9 03/04/2023   ALKPHOS 58 03/04/2023   AST 62 (H) 03/04/2023   ALT 39 03/04/2023   ANIONGAP 11 03/04/2023    CBG (last 3)  Recent Labs    03/05/23 0709 03/05/23 1110 03/05/23 1623  GLUCAP 133* 105* 104*      Coagulation Profile: No results for input(s): "INR", "PROTIME" in the last 168 hours.   Radiology Studies: No results found.     Kathlen Mody M.D. [facility] 03/05/2023, 4:55 PM  Available via Epic secure chat 7am-7pm After 7 pm, please refer to night coverage provider listed on amion.

## 2023-03-05 NOTE — Plan of Care (Signed)
  Problem: Health Behavior/Discharge Planning: Goal: Ability to identify and utilize available resources and services will improve Outcome: Completed/Met Goal: Ability to manage health-related needs will improve Outcome: Not Applicable

## 2023-03-05 NOTE — Plan of Care (Signed)
  Problem: Health Behavior/Discharge Planning: Goal: Ability to identify and utilize available resources and services will improve Outcome: Progressing Goal: Ability to manage health-related needs will improve Outcome: Progressing   Problem: Metabolic: Goal: Ability to maintain appropriate glucose levels will improve Outcome: Progressing   Problem: Nutritional: Goal: Maintenance of adequate nutrition will improve Outcome: Progressing Goal: Progress toward achieving an optimal weight will improve Outcome: Progressing   Problem: Skin Integrity: Goal: Risk for impaired skin integrity will decrease Outcome: Progressing   Problem: Tissue Perfusion: Goal: Adequacy of tissue perfusion will improve Outcome: Progressing   Problem: Education: Goal: Knowledge of General Education information will improve Description: Including pain rating scale, medication(s)/side effects and non-pharmacologic comfort measures Outcome: Progressing   Problem: Health Behavior/Discharge Planning: Goal: Ability to manage health-related needs will improve Outcome: Progressing   Problem: Clinical Measurements: Goal: Ability to maintain clinical measurements within normal limits will improve Outcome: Progressing Goal: Will remain free from infection Outcome: Progressing Goal: Diagnostic test results will improve Outcome: Progressing Goal: Respiratory complications will improve Outcome: Progressing Goal: Cardiovascular complication will be avoided Outcome: Progressing   Problem: Activity: Goal: Risk for activity intolerance will decrease Outcome: Progressing   Problem: Nutrition: Goal: Adequate nutrition will be maintained Outcome: Progressing   Problem: Coping: Goal: Level of anxiety will decrease Outcome: Progressing   Problem: Elimination: Goal: Will not experience complications related to bowel motility Outcome: Progressing Goal: Will not experience complications related to urinary  retention Outcome: Progressing   Problem: Pain Managment: Goal: General experience of comfort will improve Outcome: Progressing   Problem: Safety: Goal: Ability to remain free from injury will improve Outcome: Progressing   Problem: Skin Integrity: Goal: Risk for impaired skin integrity will decrease Outcome: Progressing

## 2023-03-06 ENCOUNTER — Ambulatory Visit: Payer: Medicare HMO | Admitting: Podiatry

## 2023-03-06 DIAGNOSIS — E876 Hypokalemia: Secondary | ICD-10-CM | POA: Diagnosis not present

## 2023-03-06 DIAGNOSIS — N170 Acute kidney failure with tubular necrosis: Secondary | ICD-10-CM | POA: Diagnosis not present

## 2023-03-06 DIAGNOSIS — E1140 Type 2 diabetes mellitus with diabetic neuropathy, unspecified: Secondary | ICD-10-CM | POA: Diagnosis not present

## 2023-03-06 DIAGNOSIS — M79604 Pain in right leg: Secondary | ICD-10-CM | POA: Diagnosis not present

## 2023-03-06 DIAGNOSIS — I825Z9 Chronic embolism and thrombosis of unspecified deep veins of unspecified distal lower extremity: Secondary | ICD-10-CM | POA: Diagnosis not present

## 2023-03-06 DIAGNOSIS — N179 Acute kidney failure, unspecified: Secondary | ICD-10-CM | POA: Diagnosis not present

## 2023-03-06 LAB — GLUCOSE, CAPILLARY
Glucose-Capillary: 129 mg/dL — ABNORMAL HIGH (ref 70–99)
Glucose-Capillary: 166 mg/dL — ABNORMAL HIGH (ref 70–99)
Glucose-Capillary: 139 mg/dL — ABNORMAL HIGH (ref 70–99)
Glucose-Capillary: 157 mg/dL — ABNORMAL HIGH (ref 70–99)

## 2023-03-06 LAB — CBC WITH DIFFERENTIAL/PLATELET
Abs Immature Granulocytes: 0.11 10*3/uL — ABNORMAL HIGH (ref 0.00–0.07)
Basophils Absolute: 0 10*3/uL (ref 0.0–0.1)
Basophils Relative: 1 %
Eosinophils Absolute: 0 10*3/uL (ref 0.0–0.5)
Eosinophils Relative: 1 %
HCT: 35.4 % — ABNORMAL LOW (ref 36.0–46.0)
Hemoglobin: 11.6 g/dL — ABNORMAL LOW (ref 12.0–15.0)
Immature Granulocytes: 2 %
Lymphocytes Relative: 21 %
Lymphs Abs: 1.6 10*3/uL (ref 0.7–4.0)
MCH: 29.7 pg (ref 26.0–34.0)
MCHC: 32.8 g/dL (ref 30.0–36.0)
MCV: 90.5 fL (ref 80.0–100.0)
Monocytes Absolute: 1 10*3/uL (ref 0.1–1.0)
Monocytes Relative: 14 %
Neutro Abs: 4.6 10*3/uL (ref 1.7–7.7)
Neutrophils Relative %: 61 %
Platelets: 171 10*3/uL (ref 150–400)
RBC: 3.91 MIL/uL (ref 3.87–5.11)
RDW: 14.8 % (ref 11.5–15.5)
WBC: 7.4 10*3/uL (ref 4.0–10.5)
nRBC: 0 % (ref 0.0–0.2)

## 2023-03-06 LAB — BASIC METABOLIC PANEL
Anion gap: 11 (ref 5–15)
BUN: 27 mg/dL — ABNORMAL HIGH (ref 8–23)
CO2: 20 mmol/L — ABNORMAL LOW (ref 22–32)
Calcium: 8.5 mg/dL — ABNORMAL LOW (ref 8.9–10.3)
Chloride: 104 mmol/L (ref 98–111)
Creatinine, Ser: 1.32 mg/dL — ABNORMAL HIGH (ref 0.44–1.00)
GFR, Estimated: 41 mL/min — ABNORMAL LOW (ref >60)
Glucose, Bld: 115 mg/dL — ABNORMAL HIGH (ref 70–99)
Potassium: 3.9 mmol/L (ref 3.5–5.1)
Sodium: 135 mmol/L (ref 135–145)

## 2023-03-06 LAB — HEMOGLOBIN A1C
Hgb A1c MFr Bld: 5.9 % — ABNORMAL HIGH (ref 4.8–5.6)
Mean Plasma Glucose: 122.63 mg/dL

## 2023-03-06 LAB — CK: Total CK: 193 U/L (ref 38–234)

## 2023-03-06 NOTE — Progress Notes (Addendum)
Physical Therapy Treatment Patient Details Name: Sharon Michael MRN: [id number] DOB: [birth year] Today's Date: 03/06/2023   History of Present Illness 77 y.o. female presents to [facility] on 03/02/2023 with bilateral foot pain and RLE swelling, unable to ambulate due to symptoms. Pt found to have Acute Renal Failure and mild Rhabdomyolysis. RLE doppler positive for DVT on anticoagulant as of 10/18. X-ray findings negative for bil feet other than RLE heel spur. PMH includes DM, HTN, HLD.    PT Comments  Pt received in supine, A&O x3-4 and with noted short term memory deficits recalling recent functioning and benefits from [employer] strategy for sequencing/safety during functional tasks. Pt with good effort and participation in transfer training this date and good initiation. Pt performed transfers, needing very elevated surface height to perform sit<>stand to RW/Stedy. Poor tolerance and safety during step pivot transfer with RW due to posterior lean and difficulty utilizing RW safely, so used Stedy for transfer from [facility] and to recliner. Chair alarm on for safety and RN notified of safe technique for return transfer with [facility] lift +2-3 assist or could use hoyer lift for return if lift pad obtained. Pt continues to benefit from PT services to progress toward functional mobility goals, disposition updated below as per chart review and pt progress, pt not yet able to tolerate higher intensity therapies, discussed with pt and supervising PT Margie M.    If plan is discharge home, recommend the following: A lot of help with bathing/dressing/bathroom;Assistance with cooking/housework;Help with stairs or ramp for entrance;Assist for transportation;Two people to help with walking and/or transfers;Direct supervision/assist for medications management;Supervision due to cognitive status   Can travel by private vehicle     No  Equipment Recommendations  None recommended by PT;Other (comment) (TBD  for post-acute rehab; currently would need hospital bed and mechanical lift if she went home; pending progress)    Recommendations for Other Services       Precautions / Restrictions Precautions Precautions: Fall Precaution Comments: Recent fall unable to get up off floor with family help therefore admitted Restrictions Weight Bearing Restrictions: No     Mobility  Bed Mobility Overal bed mobility: Needs Assistance Bed Mobility: Supine to Sit     Supine to sit: Min assist, HOB elevated     General bed mobility comments: Supervision for supine to long sitting from elevated HOB but pt with persistent posterior lean with attempt at dynamic seated task in long sit and EOB. MinA to advance her hips with bed pad/scooting.    Transfers Overall transfer level: Needs assistance Equipment used: Rolling walker (2 wheels), Ambulation equipment used Transfers: Sit to/from Stand, Bed to chair/wheelchair/BSC Sit to Stand: Max assist, +2 physical assistance, From elevated surface, Via lift equipment           General transfer comment: Unable to stand from lowest bed height or 1-2" elevated, bed elevated 4-5" prior to pt successfully standing to RW and pt with persistent posterior lean. STS x2 from elevated bed>RW and then step pivot to her L to BSC, posterior lean and difficulty weight shifting and accepting weight through RLE to move her LLE. Used Stedy for [facility] to chair transfer with very heavy +2 maxA lift to achieve upright to [place]. Multiple attempts to stand from each surface height, even elevated Stedy, difficulty extending knees and with anterior lean despite multimodal cues, visual demo. Transfer via Lift Equipment: Stedy  Ambulation/Gait Ambulation/Gait assistance: Max assist, +2 physical assistance Gait Distance (Feet): 3 Feet Assistive device:  Rolling walker (2 wheels) Gait Pattern/deviations: Step-to pattern, Narrow base of support, Shuffle, Decreased weight shift to right,  Decreased step length - left, Leaning posteriorly       General Gait Details: very unsafe, posterior lean, unable to manage RW on her own, BSC pulled closer to her hips prior to pt sitting down, hand over hand assist to reach for [facility]. Defer additional attempts due to pt fatigue/assist level needed.   Stairs             Wheelchair Mobility     Tilt Bed    Modified Rankin (Stroke Patients Only)       Balance Overall balance assessment: Needs assistance Sitting-balance support: No upper extremity supported, Feet supported Sitting balance-Leahy Scale: Fair Sitting balance - Comments: static sitting fair, Poor dynamic seated balance in long sit and EOB, attempting to don her socks. Pt able to achieve figure 4 with LLE in long sitting. Postural control: Posterior lean Standing balance support: Bilateral upper extremity supported, Reliant on assistive device for balance Standing balance-Leahy Scale: Zero Standing balance comment: +2 maxA for static standing and dynamic task attempts                            Cognition Arousal: Alert Behavior During Therapy: WFL for tasks assessed/performed Overall Cognitive Status: Within Functional Limits for tasks assessed                                 General Comments: Poor recall of cues for safe UE placement and sequencing of transfers. Pt A&O to self, month/year and day of week, needs hint for "hospital" and able to state [facility] after that. Inconsistent recall of recent activity (pt states she used BSC but per NT was using bed pan this date). Some slow processing.        Exercises      General Comments General comments (skin integrity, edema, etc.): Pt unable to perform her own peri-care in standing after BM on BSC, poor standing balance and posterior lean. RN notified pt purewick removed due to soiling during hygiene assist/toileting prior to transfer up to the recliner. Pt set up to eat lunch in  chair.      Pertinent Vitals/Pain Pain Assessment Pain Assessment: Faces Faces Pain Scale: Hurts even more Pain Location: bil feet Pain Descriptors / Indicators: Discomfort, Tender, Grimacing Pain Intervention(s): Limited activity within patient's tolerance, Monitored during session, Repositioned    Home Living                          Prior Function            PT Goals (current goals can now be found in the care plan section) Acute Rehab PT Goals Patient Stated Goal: get better and get home PT Goal Formulation: With patient Time For Goal Achievement: 03/18/23 Progress towards PT goals: Progressing toward goals    Frequency    Min 1X/week      PT Plan      Co-evaluation PT/OT/SLP Co-Evaluation/Treatment: Yes Reason for Co-Treatment: For patient/therapist safety;To address functional/ADL transfers PT goals addressed during session: Mobility/safety with mobility;Balance;Proper use of DME        AM-PAC PT "6 Clicks" Mobility   Outcome Measure  Help needed turning from your back to your side while in a flat bed without using bedrails?:  A Little Help needed moving from lying on your back to sitting on the side of a flat bed without using bedrails?: A Little Help needed moving to and from a bed to a chair (including a wheelchair)?: Total (Stedy) Help needed standing up from a chair using your arms (e.g., wheelchair or bedside chair)?: Total (unable to stand from low surface with RW, needs elevated surface) Help needed to walk in hospital room?: Total Help needed climbing 3-5 steps with a railing? : Total 6 Click Score: 10    End of Session Equipment Utilized During Treatment: Gait belt;Other (comment) (Stedy lift) Activity Tolerance: Patient limited by pain;Patient tolerated treatment well (bil foot pain) Patient left: with call bell/phone within reach;in chair;with chair alarm set;Other (comment) (set up to eat lunch) Nurse Communication: Mobility  status;Need for lift equipment;Other (comment) (bil foot pain, technique for standing with Stedy, assist level) PT Visit Diagnosis: Other abnormalities of gait and mobility (R26.89);Muscle weakness (generalized) (M62.81);Difficulty in walking, not elsewhere classified (R26.2);Pain Pain - Right/Left: Left Pain - part of body: Ankle and joints of foot     Time: 9562-1308 PT Time Calculation (min) (ACUTE ONLY): 32 min  Charges:    $Therapeutic Activity: 8-22 mins PT General Charges $$ ACUTE PT VISIT: 1 Visit                     Keyli Duross P., PTA Acute Rehabilitation Services Secure Chat Preferred 9a-5:30pm Office: [phone]    Dorathy Kinsman [facility] 03/06/2023, 12:51 PM

## 2023-03-06 NOTE — Plan of Care (Signed)
  Problem: Health Behavior/Discharge Planning: Goal: Ability to manage health-related needs will improve Outcome: Progressing   

## 2023-03-06 NOTE — Evaluation (Signed)
Occupational Therapy Evaluation Patient Details Name: Sharon Michael MRN: [id number] DOB: 06/08/1945 Today's Date: 03/06/2023   History of Present Illness 77 y.o. female presents to [facility] hospital on 03/02/2023 with bilateral foot pain and RLE swelling, unable to ambulate due to symptoms. Pt found to have Acute Renal Failure and mild Rhabdomyolysis. RLE doppler positive for DVT on anticoagulant as of 10/18. X-ray findings negative for bil feet other than RLE heel spur. PMH includes DM, HTN, HLD.   Clinical Impression   Prior to admission, Pt mod I for ADL and mobility. Today Pt is max A +2 for transfers and benefit from stedy. She is max A for LB ADL and mod A to set up for UB ADL. She presents with decreased cognition including memory, attention, problem solving, and requires multimodal cues throughout session. Pt is very cooperative and pleasant throughout session - at this time recommending <3 hour therapy post-acute to maximize safety and independence in ADL and functional transfers as I do not think she will tolerate more intensive rehab.        If plan is discharge home, recommend the following: Two people to help with walking and/or transfers;A lot of help with bathing/dressing/bathroom;Direct supervision/assist for medications management;Direct supervision/assist for financial management;Assist for transportation;Help with stairs or ramp for entrance    Functional Status Assessment  Patient has had a recent decline in their functional status and demonstrates the ability to make significant improvements in function in a reasonable and predictable amount of time.  Equipment Recommendations  Wheelchair (measurements OT);Wheelchair cushion (measurements OT);BSC/3in1;Other (comment) (defer to next venue of care)    Recommendations for Other Services PT consult     Precautions / Restrictions Precautions Precautions: Fall Precaution Comments: Recent fall unable to get up off floor with  family help therefore admitted Restrictions Weight Bearing Restrictions: No      Mobility Bed Mobility Overal bed mobility: Needs Assistance Bed Mobility: Supine to Sit     Supine to sit: Min assist, HOB elevated     General bed mobility comments: Supervision for supine to long sitting from elevated HOB but pt with persistent posterior lean with attempt at dynamic seated task in long sit and EOB. MinA to advance her hips with bed pad/scooting.    Transfers Overall transfer level: Needs assistance Equipment used: Rolling walker (2 wheels), Ambulation equipment used Transfers: Sit to/from Stand, Bed to chair/wheelchair/BSC Sit to Stand: Max assist, +2 physical assistance, From elevated surface, Via lift equipment           General transfer comment: Unable to stand from lowest bed height or 1-2" elevated, bed elevated 4-5" prior to pt successfully standing to RW and pt with persistent posterior lean. STS x2 from elevated bed>RW and then step pivot to her L to BSC, posterior lean and difficulty weight shifting and accepting weight through RLE to move her LLE. Used Stedy for [facility] to chair transfer with very heavy +2 maxA lift to achieve upright to [place]. Multiple attempts to stand from each surface height, even elevated Stedy, difficulty extending knees and with anterior lean despite multimodal cues, visual demo. Transfer via Lift Equipment: Stedy    Balance Overall balance assessment: Needs assistance Sitting-balance support: No upper extremity supported, Feet supported Sitting balance-Leahy Scale: Fair Sitting balance - Comments: static sitting fair, Poor dynamic seated balance in long sit and EOB, attempting to don her socks. Pt able to achieve figure 4 with LLE in long sitting. Postural control: Posterior lean Standing balance support: Bilateral upper  extremity supported, Reliant on assistive device for balance Standing balance-Leahy Scale: Zero Standing balance comment: +2 maxA  for static standing and dynamic task attempts                           ADL either performed or assessed with clinical judgement   ADL Overall ADL's : Needs assistance/impaired Eating/Feeding: Set up;Sitting Eating/Feeding Details (indicate cue type and reason): in recliner Grooming: Set up;Wash/dry face;Wash/dry hands;Oral care;Sitting   Upper Body Bathing: Moderate assistance Upper Body Bathing Details (indicate cue type and reason): for back Lower Body Bathing: Moderate assistance Lower Body Bathing Details (indicate cue type and reason): knees down Upper Body Dressing : Minimal assistance;Sitting   Lower Body Dressing: Maximal assistance Lower Body Dressing Details (indicate cue type and reason): donning socks - unable to perform firgure 4 at this time Toilet Transfer: Maximal assistance;+2 for physical assistance;+2 for safety/equipment;BSC/3in1;Rolling walker (2 wheels) Toilet Transfer Details (indicate cue type and reason): multimodal cues and LOTS of assist on lift Toileting- Clothing Manipulation and Hygiene: Maximal assistance;+2 for physical assistance;+2 for safety/equipment;Sit to/from stand Toileting - Clothing Manipulation Details (indicate cue type and reason): peri care in standing with assist to hold stand and also for wiping     Functional mobility during ADLs: Maximal assistance;+2 for physical assistance;+2 for safety/equipment (stand pivot only - then required stedy. was not able to take steps) General ADL Comments: decreased insight into deficits, decreased activity tolerance, balance ROM, strength etc     Vision Patient Visual Report: No change from baseline       Perception         Praxis         Pertinent Vitals/Pain Pain Assessment Pain Assessment: Faces Faces Pain Scale: Hurts even more Pain Location: bil feet Pain Descriptors / Indicators: Discomfort, Tender, Grimacing Pain Intervention(s): Monitored during session, Repositioned,  Limited activity within patient's tolerance     Extremity/Trunk Assessment Upper Extremity Assessment Upper Extremity Assessment: Generalized weakness   Lower Extremity Assessment Lower Extremity Assessment: Defer to PT evaluation   Cervical / Trunk Assessment Cervical / Trunk Assessment: Kyphotic   Communication Communication Communication: No apparent difficulties Cueing Techniques: Verbal cues;Tactile cues;Visual cues;Gestural cues   Cognition Arousal: Alert Behavior During Therapy: WFL for tasks assessed/performed Overall Cognitive Status: Impaired/Different from baseline Area of Impairment: Memory, Safety/judgement, Awareness, Problem solving                     Memory: Decreased short-term memory   Safety/Judgement: Decreased awareness of safety, Decreased awareness of deficits Awareness: Emergent Problem Solving: Slow processing, Difficulty sequencing, Requires verbal cues, Requires tactile cues General Comments: Poor recall of cues for safe UE placement and sequencing of transfers. Pt A&O to self, month/year and day of week, needs hint for "hospital" and able to state [facility] after that. Inconsistent recall of recent activity (pt states she used BSC but per NT was using bed pan this date). Some slow processing.     General Comments  Pt unable to perform her own peri-care in standing after BM on BSC, poor standing balance and posterior lean. RN notified pt purewick removed due to soiling prior to transfer up to the recliner. Pt set up to eat lunch after hygiene assist in chair.    Exercises     Shoulder Instructions      Home Living Family/patient expects to be discharged to:: Private residence Living Arrangements: Alone Available Help at Discharge: Family;Available  PRN/intermittently Type of Home: House Home Access: Stairs to enter [employer] of Steps: 2 Entrance Stairs-Rails: Right Home Layout: One level               Home  Equipment: Cane - single point          Prior Functioning/Environment Prior Level of Function : Independent/Modified Independent                        OT Problem List: Decreased strength;Decreased range of motion;Decreased activity tolerance;Impaired balance (sitting and/or standing);Decreased cognition;Decreased safety awareness;Decreased knowledge of use of DME or AE;Decreased knowledge of precautions;Obesity;Pain;Increased edema      OT Treatment/Interventions: Self-care/ADL training;DME and/or AE instruction;Therapeutic activities;Cognitive remediation/compensation;Patient/family education;Balance training    OT Goals(Current goals can be found in the care plan section) Acute Rehab OT Goals Patient Stated Goal: get the swelling out of my legs OT Goal Formulation: With patient Time For Goal Achievement: 03/20/23 Potential to Achieve Goals: Good  OT Frequency: Min 1X/week    Co-evaluation   Reason for Co-Treatment: For patient/therapist safety;To address functional/ADL transfers PT goals addressed during session: Mobility/safety with mobility;Balance;Proper use of DME        AM-PAC OT "6 Clicks" Daily Activity     Outcome Measure Help from another person eating meals?: None Help from another person taking care of personal grooming?: A Little Help from another person toileting, which includes using toliet, bedpan, or urinal?: A Lot Help from another person bathing (including washing, rinsing, drying)?: A Lot Help from another person to put on and taking off regular upper body clothing?: A Lot Help from another person to put on and taking off regular lower body clothing?: A Lot 6 Click Score: 15   End of Session Equipment Utilized During Treatment: Gait belt;Rolling walker (2 wheels);Other (comment) (stedy) Nurse Communication: Mobility status;Need for lift equipment;Precautions (no purewick)  Activity Tolerance: Patient tolerated treatment well;Patient limited by  pain Patient left: in chair;with call bell/phone within reach;with chair alarm set  OT Visit Diagnosis: Unsteadiness on feet (R26.81);Other abnormalities of gait and mobility (R26.89);Muscle weakness (generalized) (M62.81);Pain Pain - Right/Left: Right (Bilateral) Pain - part of body: Ankle and joints of foot                Time: 6962-9528 OT Time Calculation (min): 36 min Charges:  OT General Charges $OT Visit: 1 Visit OT Evaluation $OT Eval Moderate Complexity: 1 Mod  Nyoka Cowden OTR/L Acute Rehabilitation Services Office: [phone]  Emelda Fear 03/06/2023, 2:22 PM

## 2023-03-06 NOTE — NC FL2 (Signed)
[place] MEDICAID FL2 LEVEL OF CARE FORM     IDENTIFICATION  Patient Name: Sharon Michael Birthdate: 1946/05/12 Sex: female Admission Date (Current Location): 03/02/2023  [facility] and [place] Number:  Producer, television/film/video and Address:  [facility], 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) 03/03/2023   Hypokalemia 03/03/2023   Acute renal failure superimposed on stage 3b chronic kidney disease (HCC) 03/03/2023   Benign essential HTN 03/03/2023   Gout 03/03/2023   Callus 02/05/2021   Unilateral primary osteoarthritis, right knee 07/16/2018   Chronic pain of left knee 02/08/2017   Unilateral primary osteoarthritis, left knee 02/08/2017   Type 2 diabetes, controlled, with neuropathy (HCC) 03/19/2013    Orientation RESPIRATION BLADDER Height & Weight     Self, Time, Situation, Place  Normal Continent, External catheter Weight: 206 lb (93.4 kg) Height:  5\' 5"  (165.1 cm)  BEHAVIORAL SYMPTOMS/MOOD NEUROLOGICAL BOWEL NUTRITION STATUS      Continent Diet (see d/c summary)  AMBULATORY STATUS COMMUNICATION OF NEEDS Skin   Extensive Assist Verbally Normal                       Personal Care Assistance Level of Assistance  Bathing, Dressing, Feeding Bathing Assistance: Maximum assistance Feeding assistance: Independent Dressing Assistance: Maximum assistance     Functional Limitations Info  Sight, Hearing, Speech Sight Info: Adequate Hearing Info: Adequate Speech Info: Adequate    SPECIAL CARE  FACTORS FREQUENCY  OT (By licensed OT), PT (By licensed PT)     PT Frequency: 5x/week OT Frequency: 5x/week            Contractures Contractures Info: Not present    Additional Factors Info  Code Status, Allergies Code Status Info: Full code Allergies Info: see d/c summary           Current Medications (03/06/2023):  This is the current hospital active medication list Current Facility-Administered Medications  Medication Dose Route Frequency Provider Last Rate Last Admin   acetaminophen (TYLENOL) tablet 650 mg  650 mg Oral Q6H PRN Orland Mustard, MD       Or   acetaminophen (TYLENOL) suppository 650 mg  650 mg Rectal Q6H PRN Orland Mustard, MD       allopurinol (ZYLOPRIM) tablet 100 mg  100 mg Oral Daily Orland Mustard, MD   100 mg at 03/06/23 0102   apixaban (ELIQUIS) tablet 10 mg  10 mg Oral BID Orland Mustard, MD   10 mg at 03/06/23 7253   Followed by   Melene Muller ON 03/10/2023] apixaban (ELIQUIS) tablet 5 mg  5 mg Oral BID Orland Mustard, MD       atorvastatin (LIPITOR) tablet 40 mg  40 mg Oral Daily Orland Mustard, MD   40 mg at 03/06/23 6644   gabapentin (NEURONTIN) capsule 100 mg  100 mg Oral BID Maia Plan, MD   100 mg at 03/06/23 0347   metoprolol succinate (TOPROL-XL) 24 hr tablet 100 mg  100 mg  Oral Daily Orland Mustard, MD   100 mg at 03/06/23 1914   oxybutynin (DITROPAN) tablet 5 mg  5 mg Oral BID Orland Mustard, MD   5 mg at 03/06/23 7829   oxyCODONE-acetaminophen (PERCOCET/ROXICET) 5-325 MG per tablet 1 tablet  1 tablet Oral Q4H PRN Long, Arlyss Repress, MD         Discharge Medications: Please see discharge summary for a list of discharge medications.  Relevant Imaging Results:  Relevant Lab Results:   Additional Information SSN [id number] [place], [place]

## 2023-03-06 NOTE — Progress Notes (Signed)
MEWS Progress Note  Patient Details Name: Sharon Michael MRN: [id number] DOB: 1945-12-15 Today's Date: 03/06/2023   MEWS Flowsheet Documentation:  Assess: MEWS Score Temp: 98.7 F (37.1 C) BP: 133/80 MAP (mmHg): 96 Pulse Rate: 79 ECG Heart Rate: 84 Resp: 17 Level of Consciousness: Alert SpO2: 99 % O2 Device: Room Air Patient Activity (if Appropriate): In bed Assess: MEWS Score MEWS Temp: 0 MEWS Systolic: 0 MEWS Pulse: 0 MEWS RR: 0 MEWS LOC: 0 MEWS Score: 0 MEWS Score Color: Green Assess: SIRS CRITERIA SIRS Temperature : 0 SIRS Respirations : 0 SIRS Pulse: 0 SIRS WBC: 0 SIRS Score Sum : 0          Santiago Bumpers 03/06/2023, 2:11 PM

## 2023-03-06 NOTE — TOC Initial Note (Signed)
Transition of Care [facility]) - Initial/Assessment Note    Patient Details  Name: Freda Morse MRN: [id number] Date of Birth: 03-20-1946  Transition of Care [facility]) CM/SW Contact:    Erin Sons, LCSW Phone Number: 03/06/2023, 2:42 PM  Clinical Narrative:                  CSW met with pt to discuss SNF rec. She lives home alone in [place]. She is agreeable to SNF w/u in [place] area. She may move in with her brother and sister in law in [place] post rehab for long term support though this is currently undecided. CSW explained medicare coverage and insurance auth process. CSW completed Fl2 and sent bed requests in hub. TOC will follow up with SNF offers.   Expected Discharge Plan: Skilled Nursing Facility Barriers to Discharge: SNF Pending bed offer, Insurance Authorization   Patient Goals and CMS Choice            Expected Discharge Plan and Services       Living arrangements for the past 2 months: Single Family Home                                      Prior Living Arrangements/Services Living arrangements for the past 2 months: Single Family Home Lives with:: Self                   Activities of Daily Living   ADL Screening (condition at time of admission) Independently performs ADLs?: No Does the patient have a NEW difficulty with bathing/dressing/toileting/self-feeding that is expected to last >3 days?: Yes (Initiates electronic notice to provider for possible OT consult) Does the patient have a NEW difficulty with getting in/out of bed, walking, or climbing stairs that is expected to last >3 days?: Yes (Initiates electronic notice to provider for possible PT consult) Does the patient have a NEW difficulty with communication that is expected to last >3 days?: No Is the patient deaf or have difficulty hearing?: No Does the patient have difficulty seeing, even when wearing glasses/contacts?: No Does the patient have difficulty concentrating,  remembering, or making decisions?: No  Permission Sought/Granted                  Emotional Assessment Appearance:: Appears stated age Attitude/Demeanor/Rapport: Engaged Affect (typically observed): Accepting Orientation: : Oriented to Self, Oriented to Place, Oriented to  Time, Oriented to Situation Alcohol / Substance Use: Not Applicable Psych Involvement: No (comment)  Admission diagnosis:  Bilateral leg pain [M79.604, M79.605] AKI (acute kidney injury) (HCC) [N17.9] Deep vein thrombosis (DVT) of proximal lower extremity, unspecified chronicity, unspecified laterality (HCC) [I82.4Y9] Acute renal failure superimposed on stage 3b chronic kidney disease (HCC) [N17.9, N18.32] 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) 03/03/2023   Hypokalemia 03/03/2023   Acute renal failure superimposed on stage 3b chronic kidney disease (HCC) 03/03/2023   Benign essential HTN 03/03/2023   Gout 03/03/2023   Callus 02/05/2021   Unilateral primary osteoarthritis, right knee 07/16/2018   Chronic pain of left knee 02/08/2017   Unilateral primary osteoarthritis, left knee 02/08/2017   Type 2 diabetes, controlled, with neuropathy (HCC) 03/19/2013   PCP:  Fleet Contras, MD Pharmacy:   [facility] [phone] - [place], [place] - [address] AT [facility] [address] [place] [address] Phone: [phone] Fax: [phone]  [facility] - [place], [place] - [address] [place] [address] Phone: [phone] Fax: [phone]  [facility] [address] [place] [address] Phone: [phone] Fax: [phone]     Social Determinants of Health (SDOH) Social History: SDOH Screenings   Food Insecurity: No Food Insecurity (03/03/2023)  Housing: Low Risk  (03/03/2023)  Transportation Needs: No  Transportation Needs (03/03/2023)  Utilities: Not At Risk (03/03/2023)  Tobacco Use: Low Risk  (03/02/2023)   SDOH Interventions:     Readmission Risk Interventions     No data to display

## 2023-03-06 NOTE — Progress Notes (Signed)
[facility]                                                                               Sharon Michael, is a 77 y.o. female, DOB - 1945/07/11, ZHY:[id number] Admit date - 03/02/2023    Outpatient Primary MD for the patient is Fleet Contras, MD  LOS - 3  days    Brief summary    Sharon Michael is a 77 y.o. female with medical history significant of T2DM, HLD, HTN who presented to ED with complaints of leg pain.  Right foot xray: soft tissue swelling without acute finding. Heel spur Left foot xray: non specific soft tissue swelling. 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   Assessment & Plan    Assessment and Plan: * Acute renal failure superimposed on stage 3b chronic kidney disease (HCC) Probably secondary to dehydration. IV fluids on admission and repeat creatinine improved from 2.5 to 1.57.   Probably pre renal/intra renal with no PO intake since Tuesday and elevated CK,  -baseline creatinine appears to be around 1.4-1.8.  Creatinine continues to improve, its 1.32 today.  -hold nephrotoxic drugs.   Mild rhabdomyolysis: Improving CK levels.  Recheck CK in am.    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  Lives a sedentary lifestyle, but no other known provoking factor  Recheck hemoglobin in am.   Hypokalemia Replaced. Repeat level wnl.   Type 2 diabetes, controlled, with neuropathy (HCC) A1C of 6.1 in march of 2024   CBG (last 3)  Recent Labs    03/05/23 2027 03/06/23 0758 03/06/23 1122  GLUCAP 174* 129* 166*   Pt refusing SSI. Does not want to be checked.  A1c is 5.9%  Benign essential HTN Well controlled.   Gout Continue allopurinol   Mild thrombocytopenia:  Normalized.     Therapy eval  recommending SNF.  Pt stable for discharge. Currently waiting for SNF Placement.    Estimated body mass index is 34.28 kg/m as calculated from the following:   Height as of this encounter: 5\' 5"  (1.651 m).   Weight as of this encounter: 93.4 kg.  Code Status: full code.  DVT Prophylaxis:   apixaban (ELIQUIS) tablet 10 mg  apixaban (ELIQUIS) tablet 5 mg   Level of Care: Level of care: Telemetry Medical Family Communication:none at bedside.   Disposition Plan:     Remains inpatient appropriate:SNF  Procedures:  None.   Consultants:   None.   Antimicrobials:   Anti-infectives (From admission, onward)    None        Medications  Scheduled Meds:  allopurinol  100 mg Oral Daily   apixaban  10 mg Oral BID   Followed by   Melene Muller ON 03/10/2023] apixaban  5 mg Oral BID   atorvastatin  40 mg Oral Daily   gabapentin  100 mg Oral BID   metoprolol succinate  100 mg Oral Daily   oxybutynin  5 mg Oral  BID   Continuous Infusions: PRN Meds:.acetaminophen **OR** acetaminophen, oxyCODONE-acetaminophen    Subjective:   Sharon Michael was seen and examined today. NO new complaints.   Objective:   Vitals:   03/05/23 1622 03/05/23 2025 03/06/23 0613 03/06/23 0758  BP: 138/84 (!) 140/97 123/84 133/80  Pulse: 82 79 70 79  Resp: 18 19 18 17   Temp: 98.9 F (37.2 C) 99 F (37.2 C) 98.4 F (36.9 C) 98.7 F (37.1 C)  TempSrc: Oral     SpO2: 99% 100% 100% 99%  Weight:      Height:        Intake/Output Summary (Last 24 hours) at 03/06/2023 1555 Last data filed at 03/06/2023 1300 Gross per 24 hour  Intake 720 ml  Output 1900 ml  Net -1180 ml   Filed Weights   03/03/23 0501  Weight: 93.4 kg     Exam General exam: Appears calm and comfortable  Respiratory system: Clear to auscultation. Respiratory effort normal. Cardiovascular system: S1 & S2 heard, RRR. No JVD,  Gastrointestinal system: Abdomen is nondistended, soft and nontender.  Central nervous system:  Alert and oriented. No focal neurological deficits. Extremities: Symmetric 5 x 5 power. Skin: No rashes Psychiatry: Mood & affect appropriate.      Data Reviewed:  I have personally reviewed following labs and imaging studies   CBC Lab Results  Component Value Date   WBC 7.4 03/06/2023   RBC 3.91 03/06/2023   HGB 11.6 (L) 03/06/2023   HCT 35.4 (L) 03/06/2023   MCV 90.5 03/06/2023   MCH 29.7 03/06/2023   PLT 171 03/06/2023   MCHC 32.8 03/06/2023   RDW 14.8 03/06/2023   LYMPHSABS 1.6 03/06/2023   MONOABS 1.0 03/06/2023   EOSABS 0.0 03/06/2023   BASOSABS 0.0 03/06/2023     Last metabolic panel Lab Results  Component Value Date   NA 135 03/06/2023   K 3.9 03/06/2023   CL 104 03/06/2023   CO2 20 (L) 03/06/2023   BUN 27 (H) 03/06/2023   CREATININE 1.32 (H) 03/06/2023   GLUCOSE 115 (H) 03/06/2023   GFRNONAA 41 (L) 03/06/2023   GFRAA 85 (L) 07/09/2012   CALCIUM 8.5 (L) 03/06/2023   PROT 6.6 03/04/2023   ALBUMIN 2.7 (L) 03/04/2023   BILITOT 0.9 03/04/2023   ALKPHOS 58 03/04/2023   AST 62 (H) 03/04/2023   ALT 39 03/04/2023   ANIONGAP 11 03/06/2023    CBG (last 3)  Recent Labs    03/05/23 2027 03/06/23 0758 03/06/23 1122  GLUCAP 174* 129* 166*      Coagulation Profile: No results for input(s): "INR", "PROTIME" in the last 168 hours.   Radiology Studies: No results found.     Kathlen Mody M.D. [facility] 03/06/2023, 3:55 PM  Available via Epic secure chat 7am-7pm After 7 pm, please refer to night coverage provider listed on amion.

## 2023-03-06 NOTE — Progress Notes (Signed)
Inpatient Rehab Admissions Coordinator:   Per therapy recommendations, patient was screened for CIR candidacy by Megan Salon, MS, CCC-SLP . At this time, Pt. is pain limited, and not able to transfer out of bed. I do not think she's at a level to tolerate the intensity of CIR. It is also not clear that payor will approved CIR for her diagnoses.   Pt. may have potential to progress to becoming a potential CIR candidate, so CIR admissions team will follow and monitor for progress and participation with therapies and place consult order if Pt. appears to be an appropriate candidate. Please contact me with any questions.   Megan Salon, MS, CCC-SLP Rehab Admissions Coordinator  [phone] (celll) [phone] (office)

## 2023-03-06 NOTE — Care Management Important Message (Signed)
Important Message  Patient Details  Name: Sharon Michael MRN: [id number] Date of Birth: 1945/07/09   Important Message Given:  Yes - Medicare IM     Dorena Bodo 03/06/2023, 3:29 PM

## 2023-03-07 DIAGNOSIS — N1832 Chronic kidney disease, stage 3b: Secondary | ICD-10-CM | POA: Diagnosis not present

## 2023-03-07 DIAGNOSIS — N179 Acute kidney failure, unspecified: Secondary | ICD-10-CM | POA: Diagnosis not present

## 2023-03-07 LAB — GLUCOSE, CAPILLARY
Glucose-Capillary: 116 mg/dL — ABNORMAL HIGH (ref 70–99)
Glucose-Capillary: 112 mg/dL — ABNORMAL HIGH (ref 70–99)
Glucose-Capillary: 109 mg/dL — ABNORMAL HIGH (ref 70–99)
Glucose-Capillary: 124 mg/dL — ABNORMAL HIGH (ref 70–99)

## 2023-03-07 NOTE — TOC Progression Note (Signed)
Transition of Care [facility]) - Progression Note    Patient Details  Name: Sharon Michael MRN: [id number] Date of Birth: [birth year]  [facility]) CM/SW Contact  Erin Sons, [place] Phone Number: 03/07/2023, 4:33 PM  Clinical Narrative:     CSW spoke with pt's son who chose [facility] SNF.   Insurance Berkley Harvey has been initiated  CSW spoke with [facility] who is reviewing their bed availability for tomorrow.   Expected Discharge Plan: Skilled Nursing Facility Barriers to Discharge: SNF Pending bed offer, Insurance Authorization  Expected Discharge Plan and Services       Living arrangements for the past 2 months: Single Family Home                                       Social Determinants of Health (SDOH) Interventions SDOH Screenings   Food Insecurity: No Food Insecurity (03/03/2023)  Housing: Low Risk  (03/03/2023)  Transportation Needs: No Transportation Needs (03/03/2023)  Utilities: Not At Risk (03/03/2023)  Tobacco Use: Low Risk  (03/02/2023)    Readmission Risk Interventions     No data to display

## 2023-03-07 NOTE — Plan of Care (Signed)
  Problem: Clinical Measurements: Goal: Ability to maintain clinical measurements within normal limits will improve Outcome: Completed/Met

## 2023-03-07 NOTE — Plan of Care (Signed)
  Problem: Health Behavior/Discharge Planning: Goal: Ability to manage health-related needs will improve Outcome: Progressing   Problem: Clinical Measurements: Goal: Ability to maintain clinical measurements within normal limits will improve Outcome: Progressing   Problem: Activity: Goal: Risk for activity intolerance will decrease Outcome: Progressing   Problem: Nutrition: Goal: Adequate nutrition will be maintained Outcome: Progressing   Problem: Coping: Goal: Level of anxiety will decrease Outcome: Progressing   Problem: Elimination: Goal: Will not experience complications related to bowel motility Outcome: Progressing Goal: Will not experience complications related to urinary retention Outcome: Progressing   Problem: Pain Managment: Goal: General experience of comfort will improve Outcome: Progressing   Problem: Safety: Goal: Ability to remain free from injury will improve Outcome: Progressing   Problem: Skin Integrity: Goal: Risk for impaired skin integrity will decrease Outcome: Progressing   

## 2023-03-07 NOTE — TOC Progression Note (Signed)
Transition of Care [facility] - Progression Note    Patient Details  Name: Sharon Michael MRN: [id number] Date of Birth: May 25, 1945  Transition of Care [facility]) CM/SW Contact  Erin Sons, [place] Phone Number: 03/07/2023, 11:59 AM  Clinical Narrative:     CSW met with pt and provided SNF bed offers with medicare star ratings. She plans on reviewing with her son before making decision. CSW explained choice would be needed today. TOC will continue to follow  Expected Discharge Plan: Skilled Nursing Facility Barriers to Discharge: SNF Pending bed offer, Insurance Authorization  Expected Discharge Plan and Services       Living arrangements for the past 2 months: Single Family Home                                       Social Determinants of Health (SDOH) Interventions SDOH Screenings   Food Insecurity: No Food Insecurity (03/03/2023)  Housing: Low Risk  (03/03/2023)  Transportation Needs: No Transportation Needs (03/03/2023)  Utilities: Not At Risk (03/03/2023)  Tobacco Use: Low Risk  (03/02/2023)    Readmission Risk Interventions     No data to display

## 2023-03-07 NOTE — Progress Notes (Signed)
PROGRESS NOTE  Sharon Michael  DOB: Mar 19, 1946  PCP: Fleet Contras, MD ZOX:[id number]  DOA: 03/02/2023  LOS: 4 days  Hospital Day: 6  Brief narrative: Dynasty Mees is a 77 y.o. female with PMH significant for DM2, HTN, HLD 10/17, patient presented to the ED with acute worsening of chronic bilateral leg pain and right leg swelling. Initial labs showed pain/creatinine elevated to 58/2.51, CKD elevated to 1000 Right foot xray: soft tissue swelling without acute finding. Heel spur Left foot xray: non specific soft tissue swelling. 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  Admitted to [facility] for further evaluation and management  Subjective: Patient was seen and examined this afternoon.  Pleasant elderly African-American female.  Propped up in bed.  Not in distress.  No new symptoms.  She is waiting for son to choose SNF for discharge.  Chart reviewed Hemodynamically stable Most recent labs from 10/21 with BUN/creatinine 27/1.32, hemoglobin 11.6  Assessment and plan: AKI on CKD 3B  Likely secondary to dehydration.   Baseline creatinine 1.8 to from June 2024.  Presented with creatinine elevated 2.51.  Gradually improved with IV hydration.  1.32 on last blood work on 10/21.   Avoid NSAIDs Recent Labs    08/02/22 0000 11/07/22 1415 11/09/22 0000 03/02/23 2331 03/03/23 1216 03/04/23 0742 03/06/23 0529  BUN 27* 37* 33* 58* 56* 52* 27*  CREATININE 1.43* 2.20* 1.82* 2.51* 1.92* 1.57* 1.32*   Mild rhabdomyolysis CK level improved with IV fluid. Recent Labs  Lab 03/03/23 1216 03/04/23 0742 03/06/23 0529  CKTOTAL 1,006* 632* 193    Right leg chronic DVT  Patient does not recall any treatment in the past for DVT Lives a sedentary lifestyle. She has been started on Eliquis.  No active bleeding.  Continue to monitor hemoglobin.  Stable so far. Recent Labs     08/02/22 0000 11/07/22 1415 03/02/23 2331 03/04/23 0742 03/06/23 0529  HGB 13.1 15.0 12.6 12.2 11.6*  MCV 88.3 90.5 90.2 91.3 90.5    Hypokalemia Potassium level improved with replacement. Recent Labs  Lab 03/02/23 2331 03/03/23 1216 03/04/23 0742 03/06/23 0529  K 3.3* 3.2* 3.5 3.9  MG  --  2.0  --   --    Type 2 diabetes mellitus Peripheral neuropathy A1c 5.9 on 03/06/2023 PTA meds- metformin 500 mg daily which is currently on hold.  I would not resume her metformin at discharge given elevated creatinine. Blood sugar level controlled with SSI/Accu-Cheks Continue Neurontin Recent Labs  Lab 03/06/23 1122 03/06/23 1632 03/06/23 2052 03/07/23 0739 03/07/23 1115  GLUCAP 166* 139* 157* 116* 112*   Essential HTN PTA meds- Toprol 100 mg daily, amlodipine 10 mg daily, HCTZ 12.5 mg daily, telmisartan 80 mg daily Currently blood pressure controlled with Toprol 100 mg daily only.  Amlodipine, HCTZ and telmisartan on hold. IV hydralazine as needed.   Bladder incontinence Continue Ditropan 5 mg twice daily  Gout Continue allopurinol    Impaired mobility Right foot xray: soft tissue swelling without acute finding. Heel spur Left foot xray: non specific soft tissue swelling. No acute finding Percocet as needed for pain controlled Seen by PT.  SNF recommended  Goals of care   Code Status: Full Code  Full code   DVT prophylaxis:   apixaban (ELIQUIS) tablet 10 mg  apixaban (ELIQUIS) tablet 5 mg   Antimicrobials: None Fluid: None Consultants: None Family Communication: None at bedside  Status: Inpatient Level of care:  Telemetry Medical   Patient is from: Home Needs to continue in-hospital care: Medically stable.  Pending SNF    Diet:  Diet Order             Diet Carb Modified Fluid consistency: Thin; Room service appropriate? Yes  Diet effective now                   Scheduled Meds:  allopurinol  100 mg Oral Daily   apixaban  10 mg Oral BID    Followed by   Melene Muller ON 03/10/2023] apixaban  5 mg Oral BID   atorvastatin  40 mg Oral Daily   gabapentin  100 mg Oral BID   metoprolol succinate  100 mg Oral Daily   oxybutynin  5 mg Oral BID    PRN meds: acetaminophen **OR** acetaminophen, oxyCODONE-acetaminophen   Infusions:    Antimicrobials: Anti-infectives (From admission, onward)    None       Objective: Vitals:   03/07/23 0437 03/07/23 0739  BP: 122/84 (!) 166/94  Pulse: 70 71  Resp: 18 17  Temp: 98.6 F (37 C) 98.6 F (37 C)  SpO2: 99% 100%    Intake/Output Summary (Last 24 hours) at 03/07/2023 1508 Last data filed at 03/07/2023 0900 Gross per 24 hour  Intake 560 ml  Output 750 ml  Net -190 ml   Filed Weights   03/03/23 0501  Weight: 93.4 kg   Weight change:  Body mass index is 34.28 kg/m.   Physical Exam: General exam: Pleasant, elderly African-American female.  Not in distress Skin: No rashes, lesions or ulcers. HEENT: Atraumatic, normocephalic, no obvious bleeding Lungs: Clear to auscultation bilaterally CVS: Regular rate and rhythm, no murmur GI/Abd soft, nontender, nondistended, bowel sound present CNS: Alert, awake, oriented to place and person. Psychiatry: Mood appropriate.   Extremities: No pedal edema, no calf tenderness  Data Review: I have personally reviewed the laboratory data and studies available.  F/u labs ordered Unresulted Labs (From admission, onward)     Start     Ordered   03/08/23 0500  Basic metabolic panel  Tomorrow morning,   R       Question:  Specimen collection method  Answer:  Lab=Lab collect   03/07/23 1508   03/08/23 0500  CBC with Differential/Platelet  Tomorrow morning,   R       Question:  Specimen collection method  Answer:  Lab=Lab collect   03/07/23 1508            Total time spent in review of labs and imaging, patient evaluation, formulation of plan, documentation and communication with family: 45 minutes  Signed, Lorin Glass, MD [facility] 03/07/2023

## 2023-03-08 ENCOUNTER — Other Ambulatory Visit (HOSPITAL_COMMUNITY): Payer: Self-pay

## 2023-03-08 DIAGNOSIS — M79604 Pain in right leg: Secondary | ICD-10-CM | POA: Diagnosis not present

## 2023-03-08 DIAGNOSIS — N170 Acute kidney failure with tubular necrosis: Secondary | ICD-10-CM | POA: Diagnosis not present

## 2023-03-08 DIAGNOSIS — N179 Acute kidney failure, unspecified: Secondary | ICD-10-CM | POA: Diagnosis not present

## 2023-03-08 DIAGNOSIS — N1832 Chronic kidney disease, stage 3b: Secondary | ICD-10-CM | POA: Diagnosis not present

## 2023-03-08 LAB — CBC WITH DIFFERENTIAL/PLATELET
Abs Immature Granulocytes: 0.1 10*3/uL — ABNORMAL HIGH (ref 0.00–0.07)
Basophils Absolute: 0 10*3/uL (ref 0.0–0.1)
Basophils Relative: 0 %
Eosinophils Absolute: 0.1 10*3/uL (ref 0.0–0.5)
Eosinophils Relative: 1 %
HCT: 33.1 % — ABNORMAL LOW (ref 36.0–46.0)
Hemoglobin: 11.1 g/dL — ABNORMAL LOW (ref 12.0–15.0)
Immature Granulocytes: 2 %
Lymphocytes Relative: 26 %
Lymphs Abs: 1.6 10*3/uL (ref 0.7–4.0)
MCH: 30 pg (ref 26.0–34.0)
MCHC: 33.5 g/dL (ref 30.0–36.0)
MCV: 89.5 fL (ref 80.0–100.0)
Monocytes Absolute: 0.8 10*3/uL (ref 0.1–1.0)
Monocytes Relative: 13 %
Neutro Abs: 3.7 10*3/uL (ref 1.7–7.7)
Neutrophils Relative %: 58 %
Platelets: 270 10*3/uL (ref 150–400)
RBC: 3.7 MIL/uL — ABNORMAL LOW (ref 3.87–5.11)
RDW: 14.4 % (ref 11.5–15.5)
WBC: 6.3 10*3/uL (ref 4.0–10.5)
nRBC: 0 % (ref 0.0–0.2)

## 2023-03-08 LAB — BASIC METABOLIC PANEL
Anion gap: 10 (ref 5–15)
BUN: 17 mg/dL (ref 8–23)
CO2: 22 mmol/L (ref 22–32)
Calcium: 8.8 mg/dL — ABNORMAL LOW (ref 8.9–10.3)
Chloride: 103 mmol/L (ref 98–111)
Creatinine, Ser: 1.11 mg/dL — ABNORMAL HIGH (ref 0.44–1.00)
GFR, Estimated: 51 mL/min — ABNORMAL LOW (ref >60)
Glucose, Bld: 115 mg/dL — ABNORMAL HIGH (ref 70–99)
Potassium: 3.4 mmol/L — ABNORMAL LOW (ref 3.5–5.1)
Sodium: 135 mmol/L (ref 135–145)

## 2023-03-08 LAB — GLUCOSE, CAPILLARY
Glucose-Capillary: 115 mg/dL — ABNORMAL HIGH (ref 70–99)
Glucose-Capillary: 128 mg/dL — ABNORMAL HIGH (ref 70–99)

## 2023-03-08 MED ORDER — APIXABAN 5 MG PO TABS
ORAL_TABLET | ORAL | 12 refills | Status: DC
  Filled 2023-03-08: qty 60, 28d supply, fill #0

## 2023-03-08 MED ORDER — COLCRYS 0.6 MG PO TABS: 0.6000 mg | ORAL_TABLET | Freq: Two times a day (BID) | ORAL | Status: AC

## 2023-03-08 MED ORDER — GABAPENTIN 100 MG PO CAPS
100.0000 mg | ORAL_CAPSULE | Freq: Two times a day (BID) | ORAL | 0 refills | Status: DC
  Filled 2023-03-08: qty 60, 30d supply, fill #0

## 2023-03-08 MED ORDER — COLCHICINE 0.6 MG PO TABS
0.6000 mg | ORAL_TABLET | Freq: Two times a day (BID) | ORAL | Status: DC
  Administered 2023-03-08: 0.6 mg via ORAL
  Filled 2023-03-08 (×2): qty 1

## 2023-03-08 MED ORDER — OXYCODONE-ACETAMINOPHEN 5-325 MG PO TABS: 1.0000 | ORAL_TABLET | ORAL | 0 refills | Status: DC | PRN

## 2023-03-08 NOTE — Discharge Summary (Signed)
Physician Discharge Summary  Sharon Michael UXL:[id number] DOB: 1945-05-28 DOA: 03/02/2023  PCP: Fleet Contras, MD  Admit date: 03/02/2023 Discharge date: 03/08/2023  Time spent:  37 minutes  Recommendations for Outpatient Follow-up:   will require glycemic monitoring in the outpatient setting at least twice a day at skilled Tr[facility]icity and initiation of low-dose glyburide if sugars are above 180 consistently in the setting-patient is no longer can for metformin given  acidosis/AKI on admission Please use colchicine twice daily until foot pain resolves and then resume once daily  get Chem-7, CBC in about 1 week  Discharge Diagnoses:  MAIN problem for hospitalization    acute kidney injury Right-sided DVT chronic Diabetes mellitus type 2 Hypokalemia Gout with gouty flare Bladder incontinence  Please see below for itemized issues addressed in HOpsital- refer to other progress notes for clarity if needed  Discharge Condition: Improved  Diet recommendation: Diabetic  Filed Weights   03/03/23 0501  Weight: 93.4 kg    History of present illness:  77 year old black female known  DM TY 2 HTN HLD with subacute lower extremity swelling over the past 14 days prior to admission associated foot pain inability to mobilize anorexia Lives alone son reported leg started to swell a week prior to admission DVT study showed 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  Bilateral foot pain Does not remember taking Colcrys previously so I have placed on colchicine tapering doses as above she can continue allopurinol-I do think she has multiple factors to her pain so we have prescribed a limited amount of oxycodone at discharge and we have discontinued her Relafen as this can be renally cleared and cause issues Watch for diarrhea on elevated dose of colchicine  DM TY 2 A1c this hospital stay 5.9 Blood sugars throughout hospital stay were less than 180 she previously has had an A1c of 6.1 I do not think metformin is a good idea especially given her CKD 3 at baseline but I would recommend that skilled nursing facility at least check blood sugars twice a day and initiate low-dose glyburide or glipizide with the biggest meal of the day She may benefit in the outpatient setting from augmentation of that plan For the multiple components of pain we resumed her Neurontin 100 twice daily as well at discharge--- if her pain is not improved in her lower extremity would increase the Neurontin slightly to 200 or even 300 twice daily as an outpatient  Mild rhabdomyolysis in the setting of immobility CK levels improved during hospital stay-no further workup Her LFTs were slightly elevated secondary to this as well  HTN Continuing Toprol 100 XL only-amlodipine/HCTZ stopped as above  Bladder incontinence continue Ditropan 5 twice daily  Impaired mobility x-rays showed no acute findings other than heel spur on right side limited amount of Percocet was prescribed See above discussion    Discharge  Physician Discharge Summary  Sharon Michael UXL:[id number] DOB: 1945-05-28 DOA: 03/02/2023  PCP: Fleet Contras, MD  Admit date: 03/02/2023 Discharge date: 03/08/2023  Time spent:  37 minutes  Recommendations for Outpatient Follow-up:   will require glycemic monitoring in the outpatient setting at least twice a day at skilled Tr[facility] and initiation of low-dose glyburide if sugars are above 180 consistently in the setting-patient is no longer can for metformin given  acidosis/AKI on admission Please use colchicine twice daily until foot pain resolves and then resume once daily  get Chem-7, CBC in about 1 week  Discharge Diagnoses:  MAIN problem for hospitalization    acute kidney injury Right-sided DVT chronic Diabetes mellitus type 2 Hypokalemia Gout with gouty flare Bladder incontinence  Please see below for itemized issues addressed in HOpsital- refer to other progress notes for clarity if needed  Discharge Condition: Improved  Diet recommendation: Diabetic  Filed Weights   03/03/23 0501  Weight: 93.4 kg    History of present illness:  77 year old black female known  DM TY 2 HTN HLD with subacute lower extremity swelling over the past 14 days prior to admission associated foot pain inability to mobilize anorexia Lives alone son reported leg started to swell a week prior to admission DVT study showed 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  Bilateral foot pain Does not remember taking Colcrys previously so I have placed on colchicine tapering doses as above she can continue allopurinol-I do think she has multiple factors to her pain so we have prescribed a limited amount of oxycodone at discharge and we have discontinued her Relafen as this can be renally cleared and cause issues Watch for diarrhea on elevated dose of colchicine  DM TY 2 A1c this hospital stay 5.9 Blood sugars throughout hospital stay were less than 180 she previously has had an A1c of 6.1 I do not think metformin is a good idea especially given her CKD 3 at baseline but I would recommend that skilled nursing facility at least check blood sugars twice a day and initiate low-dose glyburide or glipizide with the biggest meal of the day She may benefit in the outpatient setting from augmentation of that plan For the multiple components of pain we resumed her Neurontin 100 twice daily as well at discharge--- if her pain is not improved in her lower extremity would increase the Neurontin slightly to 200 or even 300 twice daily as an outpatient  Mild rhabdomyolysis in the setting of immobility CK levels improved during hospital stay-no further workup Her LFTs were slightly elevated secondary to this as well  HTN Continuing Toprol 100 XL only-amlodipine/HCTZ stopped as above  Bladder incontinence continue Ditropan 5 twice daily  Impaired mobility x-rays showed no acute findings other than heel spur on right side limited amount of Percocet was prescribed See above discussion    Discharge  Exam: Vitals:   03/08/23 0456 03/08/23 0822  BP: 132/87 (!) 133/90  Pulse: 80 81  Resp: 20 16  Temp: 99.2 F (37.3 C) 98.9 F (37.2 C)  SpO2: 100% 96%    Subj on day of d/c   Awake  coherent no distress looks well feels fair eating and drinking no chest pain states that her feet pain  General Exam on discharge  EOMI NCAT no focal deficit no icterus no pallor moderate dentition Younger than stated age Chest is clear no wheeze rales rhonchi S1-S2?  Murmur LLSE Abdomen soft Slightly tender great toe on the left side  Discharge Instructions   Discharge Instructions     Diet - low sodium heart healthy   Complete by: As directed    Increase activity slowly   Complete by: As directed       Allergies as of 03/08/2023       Reactions   Other         Medication List     STOP taking these medications    Accu-Chek Aviva Soln   Accu-Chek Guide Me w/Device Kit   amLODipine 10 MG tablet Commonly known as: NORVASC   metFORMIN 500 MG tablet Commonly known as: GLUCOPHAGE   nabumetone 500 MG tablet Commonly known as: RELAFEN   telmisartan-hydrochlorothiazide 80-12.5 MG tablet Commonly known as: MICARDIS HCT       TAKE these medications    allopurinol 100 MG tablet Commonly known as: ZYLOPRIM Take 100 mg by mouth daily.   apixaban 5 MG Tabs tablet Commonly known as: ELIQUIS Take 2 tablets (10 mg total) by mouth 2 (two) times daily for 2 days, THEN 1 tablet (5 mg total) 2 (two) times daily. Start taking on: March 08, 2023   atorvastatin 40 MG tablet Commonly known as: LIPITOR   Colcrys 0.6 MG tablet Generic drug: colchicine Take 1 tablet (0.6 mg total) by mouth 2 (two) times daily. Back down to once daily once acute pain in lower extremities What changed:  when to take this reasons to take this additional instructions   gabapentin 100 MG capsule Commonly known as: NEURONTIN Take 1 capsule (100 mg total) by mouth 2 (two) times daily.   metoprolol succinate 100 MG 24 hr tablet Commonly known as: TOPROL-XL Take 100 mg by mouth daily.   oxybutynin 5 MG tablet Commonly known as: DITROPAN Take 5 mg by mouth 2 (two) times daily.    oxyCODONE-acetaminophen 5-325 MG tablet Commonly known as: PERCOCET/ROXICET Take 1 tablet by mouth every 4 (four) hours as needed for severe pain (pain score 7-10).   Rocklatan 0.02-0.005 % Soln Generic drug: Netarsudil-Latanoprost Place 1 drop into both eyes at bedtime.       Allergies  Allergen Reactions   Other       The results of significant diagnostics from this hospitalization (including imaging, microbiology, ancillary and laboratory) are listed below for reference.    Significant Diagnostic Studies: VAS [place] LOWER EXTREMITY VENOUS (DVT) (7a-7p)  Result Date: 03/03/2023  Lower Venous DVT Study Patient Name:  Brystal LEE Kozinski  Date of Exam:   03/03/2023 Medical Rec #: [id number]           Accession #:    [id number] Date of Birth: 1945/06/19            Patient Gender: F Patient Age:   77 years Exam Location:  [facility] Procedure:      VAS [place] LOWER EXTREMITY VENOUS (DVT) Referring Phys: Ivin Booty  Physician Discharge Summary  Sharon Michael UXL:[id number] DOB: 1945-05-28 DOA: 03/02/2023  PCP: Fleet Contras, MD  Admit date: 03/02/2023 Discharge date: 03/08/2023  Time spent:  37 minutes  Recommendations for Outpatient Follow-up:   will require glycemic monitoring in the outpatient setting at least twice a day at skilled Trulicity and initiation of low-dose glyburide if sugars are above 180 consistently in the setting-patient is no longer can for metformin given  acidosis/AKI on admission Please use colchicine twice daily until foot pain resolves and then resume once daily  get Chem-7, CBC in about 1 week  Discharge Diagnoses:  MAIN problem for hospitalization    acute kidney injury Right-sided DVT chronic Diabetes mellitus type 2 Hypokalemia Gout with gouty flare Bladder incontinence  Please see below for itemized issues addressed in HOpsital- refer to other progress notes for clarity if needed  Discharge Condition: Improved  Diet recommendation: Diabetic  Filed Weights   03/03/23 0501  Weight: 93.4 kg    History of present illness:  77 year old black female known  DM TY 2 HTN HLD with subacute lower extremity swelling over the past 14 days prior to admission associated foot pain inability to mobilize anorexia Lives alone son reported leg started to swell a week prior to admission DVT study showed 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  Bilateral foot pain Does not remember taking Colcrys previously so I have placed on colchicine tapering doses as above she can continue allopurinol-I do think she has multiple factors to her pain so we have prescribed a limited amount of oxycodone at discharge and we have discontinued her Relafen as this can be renally cleared and cause issues Watch for diarrhea on elevated dose of colchicine  DM TY 2 A1c this hospital stay 5.9 Blood sugars throughout hospital stay were less than 180 she previously has had an A1c of 6.1 I do not think metformin is a good idea especially given her CKD 3 at baseline but I would recommend that skilled nursing facility at least check blood sugars twice a day and initiate low-dose glyburide or glipizide with the biggest meal of the day She may benefit in the outpatient setting from augmentation of that plan For the multiple components of pain we resumed her Neurontin 100 twice daily as well at discharge--- if her pain is not improved in her lower extremity would increase the Neurontin slightly to 200 or even 300 twice daily as an outpatient  Mild rhabdomyolysis in the setting of immobility CK levels improved during hospital stay-no further workup Her LFTs were slightly elevated secondary to this as well  HTN Continuing Toprol 100 XL only-amlodipine/HCTZ stopped as above  Bladder incontinence continue Ditropan 5 twice daily  Impaired mobility x-rays showed no acute findings other than heel spur on right side limited amount of Percocet was prescribed See above discussion    Discharge  Physician Discharge Summary  Sharon Michael UXL:[id number] DOB: 1945-05-28 DOA: 03/02/2023  PCP: Fleet Contras, MD  Admit date: 03/02/2023 Discharge date: 03/08/2023  Time spent:  37 minutes  Recommendations for Outpatient Follow-up:   will require glycemic monitoring in the outpatient setting at least twice a day at skilled Trulicity and initiation of low-dose glyburide if sugars are above 180 consistently in the setting-patient is no longer can for metformin given  acidosis/AKI on admission Please use colchicine twice daily until foot pain resolves and then resume once daily  get Chem-7, CBC in about 1 week  Discharge Diagnoses:  MAIN problem for hospitalization    acute kidney injury Right-sided DVT chronic Diabetes mellitus type 2 Hypokalemia Gout with gouty flare Bladder incontinence  Please see below for itemized issues addressed in HOpsital- refer to other progress notes for clarity if needed  Discharge Condition: Improved  Diet recommendation: Diabetic  Filed Weights   03/03/23 0501  Weight: 93.4 kg    History of present illness:  77 year old black female known  DM TY 2 HTN HLD with subacute lower extremity swelling over the past 14 days prior to admission associated foot pain inability to mobilize anorexia Lives alone son reported leg started to swell a week prior to admission DVT study showed 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  Bilateral foot pain Does not remember taking Colcrys previously so I have placed on colchicine tapering doses as above she can continue allopurinol-I do think she has multiple factors to her pain so we have prescribed a limited amount of oxycodone at discharge and we have discontinued her Relafen as this can be renally cleared and cause issues Watch for diarrhea on elevated dose of colchicine  DM TY 2 A1c this hospital stay 5.9 Blood sugars throughout hospital stay were less than 180 she previously has had an A1c of 6.1 I do not think metformin is a good idea especially given her CKD 3 at baseline but I would recommend that skilled nursing facility at least check blood sugars twice a day and initiate low-dose glyburide or glipizide with the biggest meal of the day She may benefit in the outpatient setting from augmentation of that plan For the multiple components of pain we resumed her Neurontin 100 twice daily as well at discharge--- if her pain is not improved in her lower extremity would increase the Neurontin slightly to 200 or even 300 twice daily as an outpatient  Mild rhabdomyolysis in the setting of immobility CK levels improved during hospital stay-no further workup Her LFTs were slightly elevated secondary to this as well  HTN Continuing Toprol 100 XL only-amlodipine/HCTZ stopped as above  Bladder incontinence continue Ditropan 5 twice daily  Impaired mobility x-rays showed no acute findings other than heel spur on right side limited amount of Percocet was prescribed See above discussion    Discharge  Exam: Vitals:   03/08/23 0456 03/08/23 0822  BP: 132/87 (!) 133/90  Pulse: 80 81  Resp: 20 16  Temp: 99.2 F (37.3 C) 98.9 F (37.2 C)  SpO2: 100% 96%    Subj on day of d/c   Awake  coherent no distress looks well feels fair eating and drinking no chest pain states that her feet pain  General Exam on discharge  EOMI NCAT no focal deficit no icterus no pallor moderate dentition Younger than stated age Chest is clear no wheeze rales rhonchi S1-S2?  Murmur LLSE Abdomen soft Slightly tender great toe on the left side  Discharge Instructions   Discharge Instructions     Diet - low sodium heart healthy   Complete by: As directed    Increase activity slowly   Complete by: As directed       Allergies as of 03/08/2023       Reactions   Other         Medication List     STOP taking these medications    Accu-Chek Aviva Soln   Accu-Chek Guide Me w/Device Kit   amLODipine 10 MG tablet Commonly known as: NORVASC   metFORMIN 500 MG tablet Commonly known as: GLUCOPHAGE   nabumetone 500 MG tablet Commonly known as: RELAFEN   telmisartan-hydrochlorothiazide 80-12.5 MG tablet Commonly known as: MICARDIS HCT       TAKE these medications    allopurinol 100 MG tablet Commonly known as: ZYLOPRIM Take 100 mg by mouth daily.   apixaban 5 MG Tabs tablet Commonly known as: ELIQUIS Take 2 tablets (10 mg total) by mouth 2 (two) times daily for 2 days, THEN 1 tablet (5 mg total) 2 (two) times daily. Start taking on: March 08, 2023   atorvastatin 40 MG tablet Commonly known as: LIPITOR   Colcrys 0.6 MG tablet Generic drug: colchicine Take 1 tablet (0.6 mg total) by mouth 2 (two) times daily. Back down to once daily once acute pain in lower extremities What changed:  when to take this reasons to take this additional instructions   gabapentin 100 MG capsule Commonly known as: NEURONTIN Take 1 capsule (100 mg total) by mouth 2 (two) times daily.   metoprolol succinate 100 MG 24 hr tablet Commonly known as: TOPROL-XL Take 100 mg by mouth daily.   oxybutynin 5 MG tablet Commonly known as: DITROPAN Take 5 mg by mouth 2 (two) times daily.    oxyCODONE-acetaminophen 5-325 MG tablet Commonly known as: PERCOCET/ROXICET Take 1 tablet by mouth every 4 (four) hours as needed for severe pain (pain score 7-10).   Rocklatan 0.02-0.005 % Soln Generic drug: Netarsudil-Latanoprost Place 1 drop into both eyes at bedtime.       Allergies  Allergen Reactions   Other       The results of significant diagnostics from this hospitalization (including imaging, microbiology, ancillary and laboratory) are listed below for reference.    Significant Diagnostic Studies: VAS [place] LOWER EXTREMITY VENOUS (DVT) (7a-7p)  Result Date: 03/03/2023  Lower Venous DVT Study Patient Name:  Brystal LEE Kozinski  Date of Exam:   03/03/2023 Medical Rec #: [id number]           Accession #:    [id number] Date of Birth: 1945/06/19            Patient Gender: F Patient Age:   77 years Exam Location:  [facility] Procedure:      VAS [place] LOWER EXTREMITY VENOUS (DVT) Referring Phys: Ivin Booty

## 2023-03-08 NOTE — TOC Transition Note (Signed)
Transition of Care [facility]) - CM/SW Discharge Note   Patient Details  Name: Sharon Michael MRN: [id number] Date of Birth: 1946/01/09  [facility] CM/SW Contact:  Erin Sons, LCSW Phone Number: 03/08/2023, 11:32 AM   Clinical Narrative:     [facility] approved  Per MD patient ready for DC to [facility]. RN, patient, patient's family, and facility notified of DC. Discharge Summary and FL2 sent to facility. RN to call report prior to discharge [phone]). DC packet on chart. Ambulance transport requested for patient.   CSW will sign off for now as social work intervention is no longer needed. Please consult [place] again if new needs arise.   Final next level of care: Skilled Nursing Facility Barriers to Discharge: No Barriers Identified   Patient Goals and CMS Choice      Discharge Placement                Patient chooses bed at: [facility] Patient to be transferred to facility by: [facility] Name of family member notified: son [name] Patient and family notified of of transfer: 03/08/23  Discharge Plan and Services Additional resources added to the After Visit Summary for                                       Social Determinants of Health (SDOH) Interventions SDOH Screenings   Food Insecurity: No Food Insecurity (03/03/2023)  Housing: Low Risk  (03/03/2023)  Transportation Needs: No Transportation Needs (03/03/2023)  Utilities: Not At Risk (03/03/2023)  Tobacco Use: Low Risk  (03/02/2023)     Readmission Risk Interventions     No data to display

## 2023-03-08 NOTE — Plan of Care (Signed)
  Problem: Health Behavior/Discharge Planning: Goal: Ability to manage health-related needs will improve Outcome: Adequate for Discharge   Problem: Activity: Goal: Risk for activity intolerance will decrease Outcome: Adequate for Discharge   Problem: Nutrition: Goal: Adequate nutrition will be maintained Outcome: Adequate for Discharge   Problem: Coping: Goal: Level of anxiety will decrease Outcome: Adequate for Discharge   Problem: Elimination: Goal: Will not experience complications related to bowel motility Outcome: Adequate for Discharge Goal: Will not experience complications related to urinary retention Outcome: Adequate for Discharge   Problem: Pain Managment: Goal: General experience of comfort will improve Outcome: Adequate for Discharge   Problem: Safety: Goal: Ability to remain free from injury will improve Outcome: Adequate for Discharge   Problem: Skin Integrity: Goal: Risk for impaired skin integrity will decrease Outcome: Adequate for Discharge

## 2023-03-09 DIAGNOSIS — N179 Acute kidney failure, unspecified: Secondary | ICD-10-CM | POA: Diagnosis not present

## 2023-03-09 DIAGNOSIS — M109 Gout, unspecified: Secondary | ICD-10-CM | POA: Diagnosis not present

## 2023-03-09 DIAGNOSIS — R32 Unspecified urinary incontinence: Secondary | ICD-10-CM | POA: Diagnosis not present

## 2023-03-09 DIAGNOSIS — I825Z9 Chronic embolism and thrombosis of unspecified deep veins of unspecified distal lower extremity: Secondary | ICD-10-CM | POA: Diagnosis not present

## 2023-03-10 DIAGNOSIS — I10 Essential (primary) hypertension: Secondary | ICD-10-CM | POA: Diagnosis not present

## 2023-03-10 DIAGNOSIS — M109 Gout, unspecified: Secondary | ICD-10-CM | POA: Diagnosis not present

## 2023-03-10 DIAGNOSIS — E1140 Type 2 diabetes mellitus with diabetic neuropathy, unspecified: Secondary | ICD-10-CM | POA: Diagnosis not present

## 2023-03-10 DIAGNOSIS — M6281 Muscle weakness (generalized): Secondary | ICD-10-CM

## 2023-03-10 DIAGNOSIS — N179 Acute kidney failure, unspecified: Secondary | ICD-10-CM | POA: Diagnosis not present

## 2023-03-10 DIAGNOSIS — R2689 Other abnormalities of gait and mobility: Secondary | ICD-10-CM

## 2023-04-05 ENCOUNTER — Ambulatory Visit: Payer: Medicare HMO | Admitting: Podiatry

## 2023-04-06 ENCOUNTER — Ambulatory Visit: Payer: Medicare HMO | Admitting: Podiatry

## 2023-04-23 IMAGING — MG MM DIGITAL SCREENING BILAT W/ TOMO AND CAD
8 series · 8 of 24 positions shown · non-contrast
Comparison: Previous exam(s).

CLINICAL DATA: Screening.

EXAM:
DIGITAL SCREENING BILATERAL MAMMOGRAM WITH TOMOSYNTHESIS AND CAD
TECHNIQUE: Bilateral screening digital craniocaudal and mediolateral oblique
mammograms were obtained. Bilateral screening digital breast
tomosynthesis was performed. The images were evaluated with
computer-aided detection.

[R CC synth-2D]
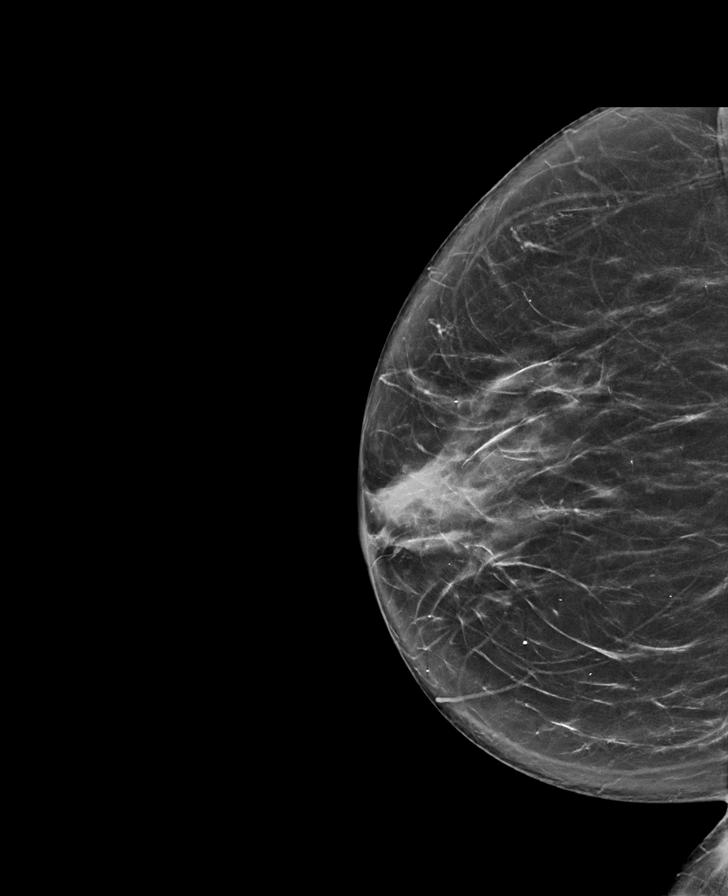

[R MLO synth-2D]
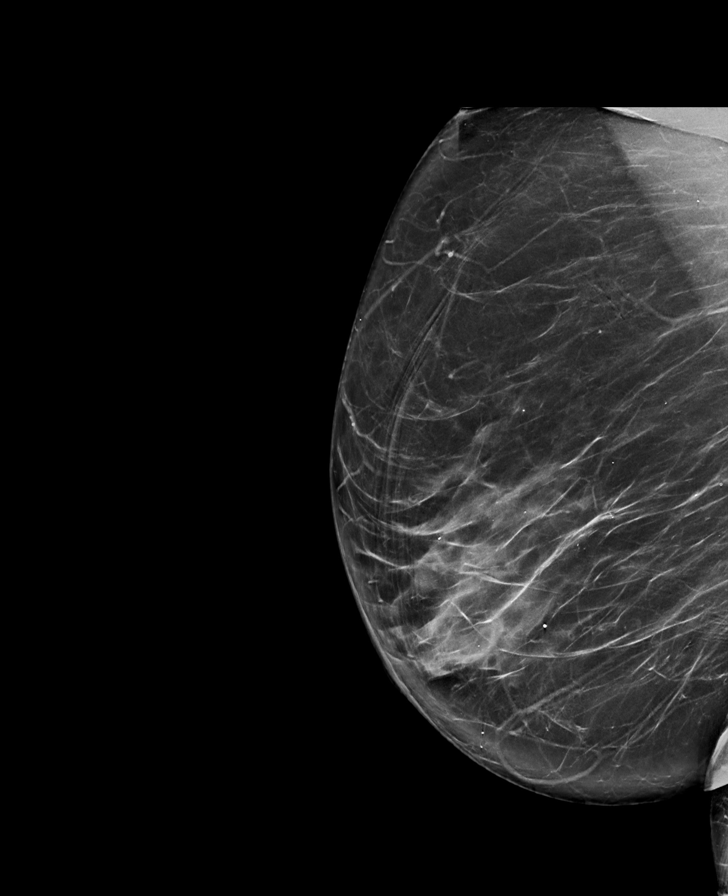

[L MLO synth-2D]
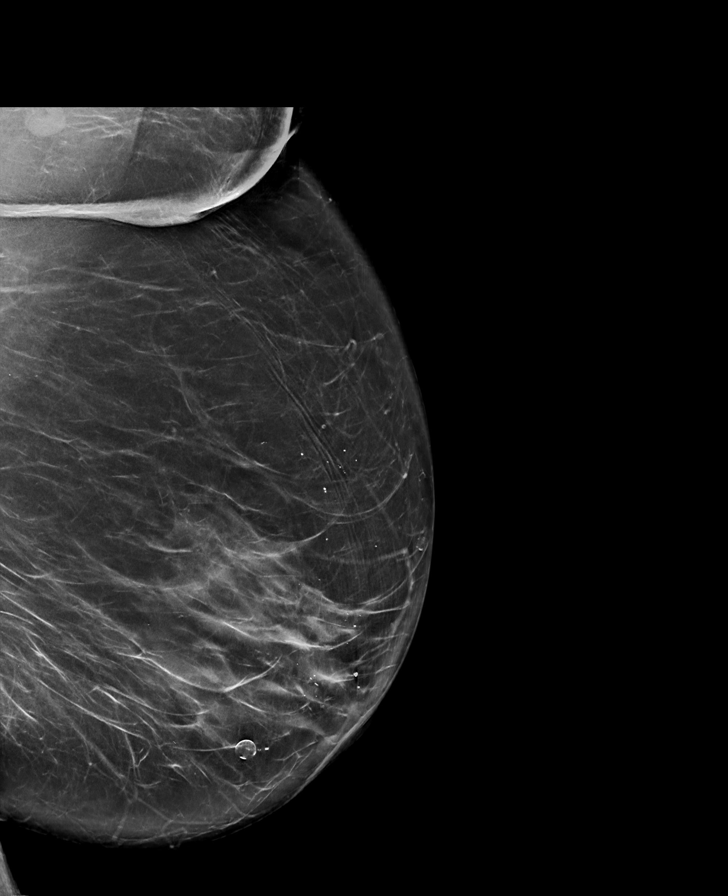

[L CC synth-2D]
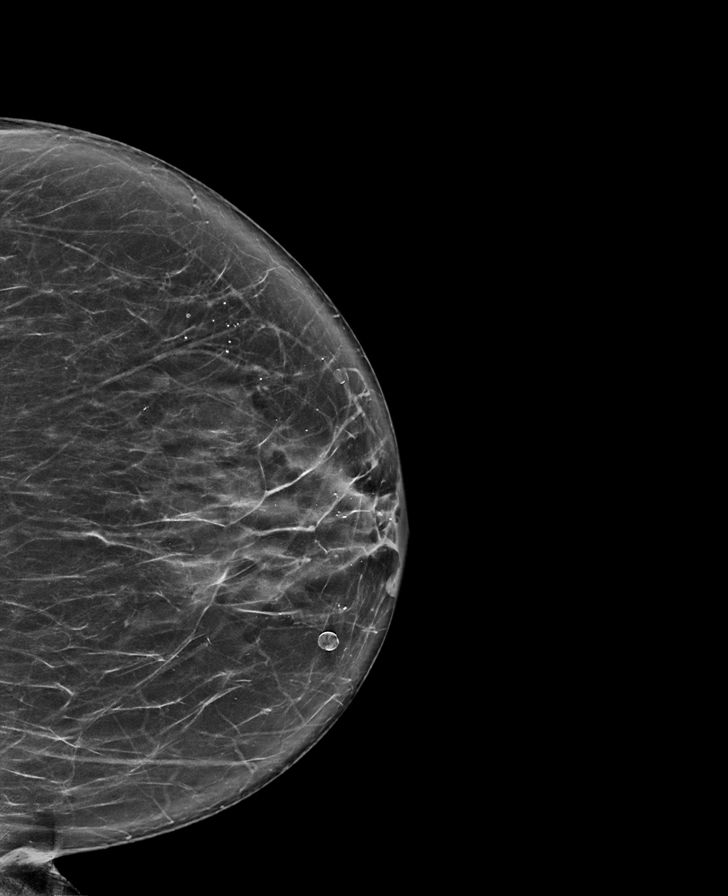

[R MLO tomo · tomo slice 45/89.0]
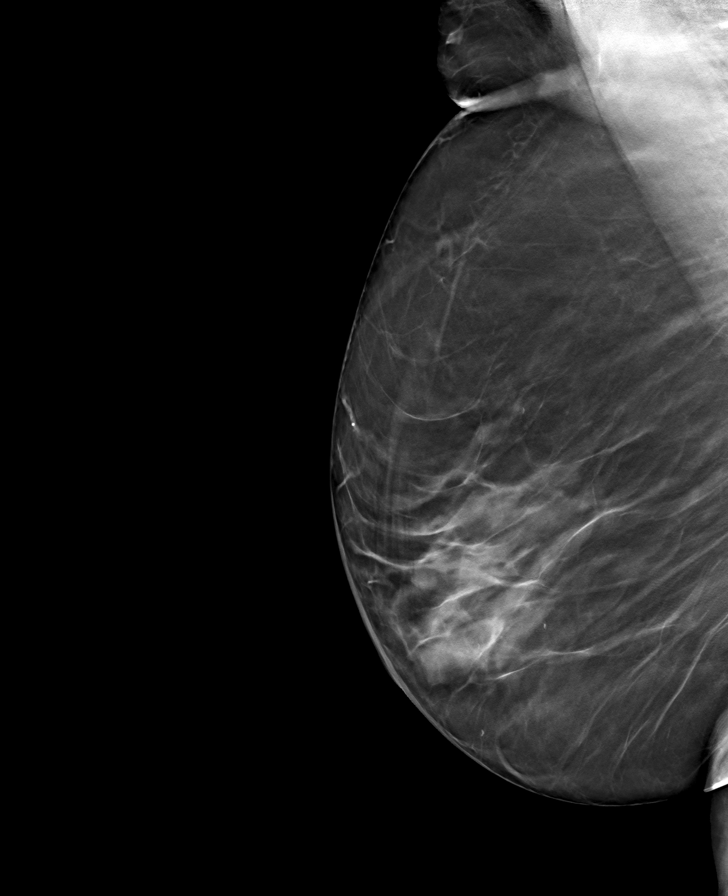

[L CC tomo · tomo slice 41/81.0]
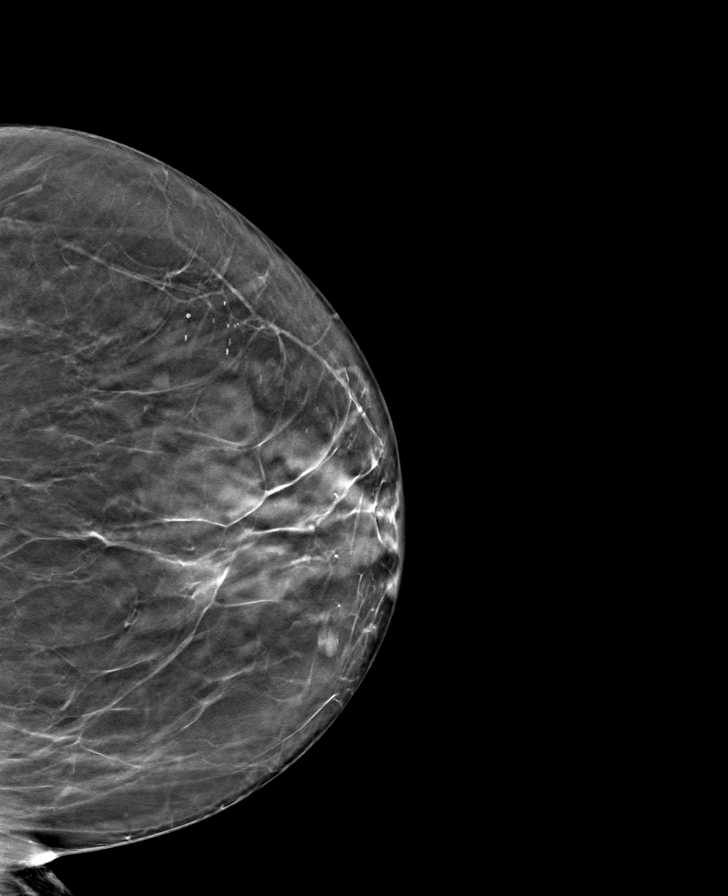

[R CC tomo · tomo slice 41/81.0]
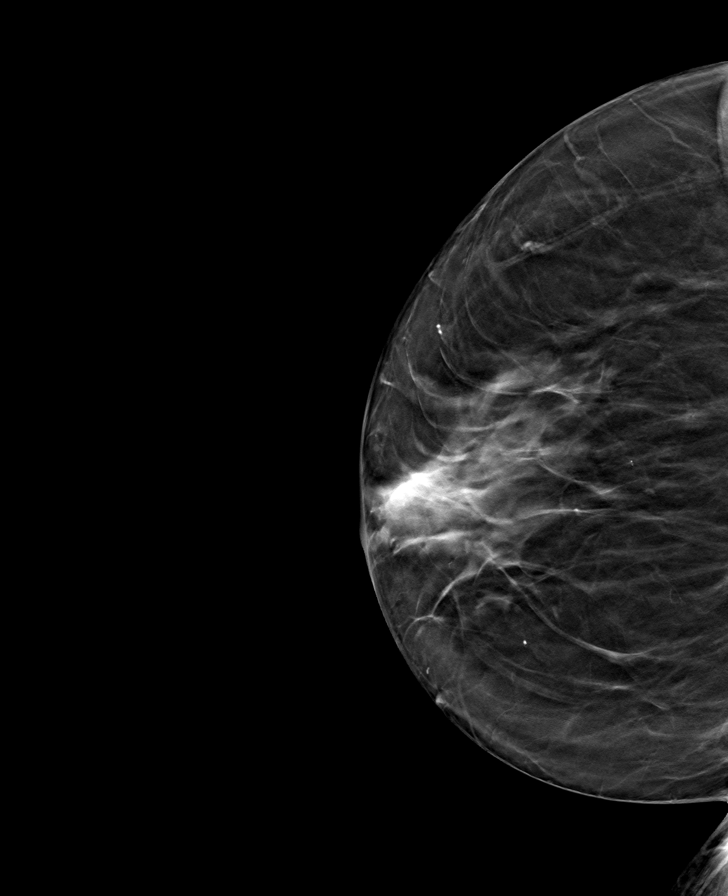

[L MLO tomo · tomo slice 47/93.0]
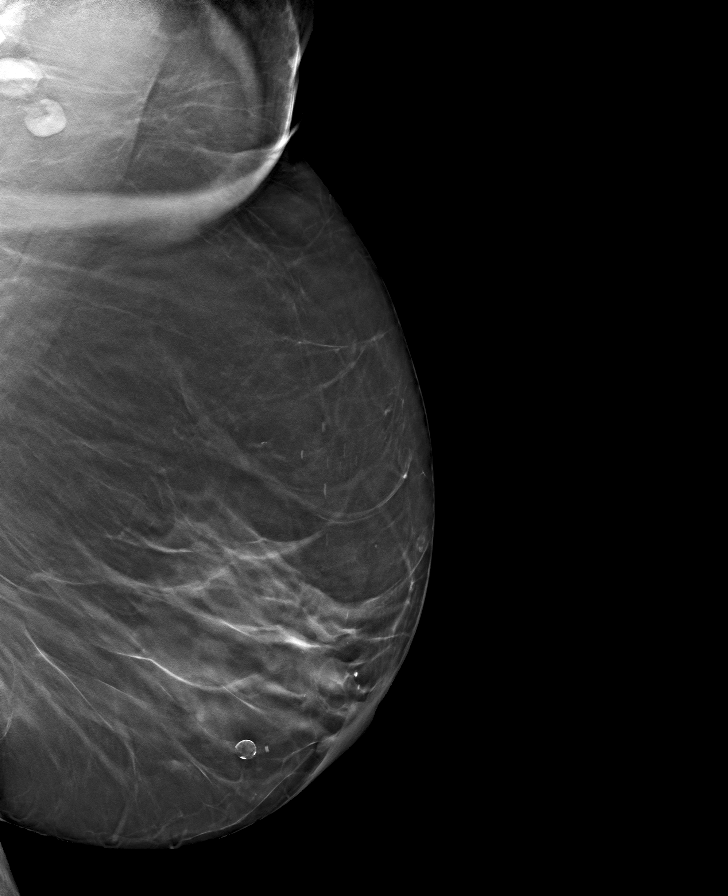

[8 of 24 positions shown; findings below may reference images not displayed]

ACR Breast Density Category c: The breast tissue is heterogeneously
dense, which may obscure small masses.
FINDINGS: There are no findings suspicious for malignancy.
IMPRESSION: No mammographic evidence of malignancy. A result letter of this
screening mammogram will be mailed directly to the patient.

RECOMMENDATION:
Screening mammogram in one year. (Code:Q3-W-BC3)

BI-RADS CATEGORY  1: Negative.

## 2023-04-28 ENCOUNTER — Encounter: Payer: Self-pay | Admitting: Podiatry

## 2023-04-28 ENCOUNTER — Ambulatory Visit (INDEPENDENT_AMBULATORY_CARE_PROVIDER_SITE_OTHER): Payer: Medicare HMO | Admitting: Podiatry

## 2023-04-28 DIAGNOSIS — B351 Tinea unguium: Secondary | ICD-10-CM

## 2023-04-28 DIAGNOSIS — I739 Peripheral vascular disease, unspecified: Secondary | ICD-10-CM | POA: Diagnosis not present

## 2023-04-28 DIAGNOSIS — E1140 Type 2 diabetes mellitus with diabetic neuropathy, unspecified: Secondary | ICD-10-CM

## 2023-04-28 NOTE — Progress Notes (Signed)
This patient returns to my office for at risk foot care.  This patient requires this care by a professional since this patient will be at risk due to having type 2 diabetes.  Patient has painful callus left foot.  This patient is unable to cut nails herself since the patient cannot reach her nails.These nails are painful walking and wearing shoes. Patient has painful callus left foot. This patient presents for at risk foot care today.  General Appearance  Alert, conversant and in no acute stress.  Vascular  Dorsalis pedis and posterior tibial  pulses are  weakly palpable  bilaterally.  Capillary return is within normal limits  bilaterally. Temperature is within normal limits  bilaterally.  Neurologic  Senn-Weinstein monofilament wire test within normal limits  bilaterally. Muscle power within normal limits bilaterally.  Nails Thick disfigured discolored nails with subungual debris  from hallux to fifth toes bilaterally. No evidence of bacterial infection or drainage bilaterally.  Orthopedic  No limitations of motion  feet .  No crepitus or effusions noted.  No bony pathology or digital deformities noted.  Skin  normotropic skin with no porokeratosis noted bilaterally.  No signs of infections or ulcers noted.     Onychomycosis  Pain in right toes  Pain in left toes  Consent was obtained for treatment procedures.   Mechanical debridement of nails 1-5  bilaterally performed with a nail nipper.  Filed with dremel without incident.    Return office visit    12 weeks                 Told patient to return for periodic foot care and evaluation due to potential at risk complications.   Helane Gunther DPM

## 2023-05-01 ENCOUNTER — Ambulatory Visit: Payer: Medicare HMO | Admitting: Physician Assistant

## 2023-05-01 ENCOUNTER — Encounter: Payer: Self-pay | Admitting: Physician Assistant

## 2023-05-01 DIAGNOSIS — M1711 Unilateral primary osteoarthritis, right knee: Secondary | ICD-10-CM | POA: Diagnosis not present

## 2023-05-01 MED ORDER — LIDOCAINE HCL 1 % IJ SOLN
3.0000 mL | INTRAMUSCULAR | Status: AC | PRN
  Administered 2023-05-01: 3 mL

## 2023-05-01 MED ORDER — METHYLPREDNISOLONE ACETATE 40 MG/ML IJ SUSP
40.0000 mg | INTRAMUSCULAR | Status: AC | PRN
  Administered 2023-05-01: 40 mg via INTRA_ARTICULAR

## 2023-05-01 NOTE — Progress Notes (Signed)
   Procedure Note  Patient: Sharon Michael             Date of Birth: 11-03-1945           MRN: [id number]             Visit Date: 05/01/2023 HPI Mrs. Feliciano comes in today for requesting right knee aspiration injection.  Last had injection in her knee on 09/22/2022 at that time the knee was aspirated and 24 cc of aspirate was obtained.  She is recently been hospitalized and was found to have a DVT this is back in October.  She is now on blood thinners.  She is having anterior and posterior pain in the knee wearing a knee brace.  Describes it as a sharp stabbing pain at times.  Notes weakness.  Wearing compression hose.  She is on physical therapy twice a week.  She also is on oxycodone and gabapentin.  She is diabetic hemoglobin A1c 1 month ago was 5.9.  Review of systems: Denies any fevers chills. Physical exam: General Well-developed well-nourished female no acute distress. Right knee: No abnormal warmth or erythema.  Positive effusion.  Tenderness lateral joint line.  Patellofemoral crepitus with passive range of motion.  Overall good range of motion knee.  Procedures: Visit Diagnoses:  1. Unilateral primary osteoarthritis, right knee     Large Joint Inj on 05/01/2023 11:45 AM Indications: pain Details: 22 G 1.5 in needle, anterolateral approach  Arthrogram: No  Medications: 3 mL lidocaine 1 %; 40 mg methylPREDNISolone acetate 40 MG/ML Aspirate: 23 mL yellow Outcome: tolerated well, no immediate complications Procedure, treatment alternatives, risks and benefits explained, specific risks discussed. Consent was given by the patient. Immediately prior to procedure a time out was called to verify the correct patient, procedure, equipment, support staff and site/side marked as required. Patient was prepped and draped in the usual sterile fashion.    Plan: She will follow-up with [place] as needed.  Questions were encouraged and answered at length.

## 2023-06-02 ENCOUNTER — Other Ambulatory Visit: Payer: Self-pay | Admitting: Internal Medicine

## 2023-06-02 DIAGNOSIS — Z1231 Encounter for screening mammogram for malignant neoplasm of breast: Secondary | ICD-10-CM

## 2023-06-19 ENCOUNTER — Ambulatory Visit
Admission: RE | Admit: 2023-06-19 | Discharge: 2023-06-19 | Disposition: A | Discharge status: Home / Self Care | Payer: Medicare HMO | Source: Ambulatory Visit | Attending: Internal Medicine | Admitting: Internal Medicine

## 2023-06-19 DIAGNOSIS — Z1231 Encounter for screening mammogram for malignant neoplasm of breast: Secondary | ICD-10-CM

## 2023-07-28 ENCOUNTER — Ambulatory Visit: Payer: Medicare HMO | Admitting: Podiatry

## 2023-08-07 ENCOUNTER — Ambulatory Visit (INDEPENDENT_AMBULATORY_CARE_PROVIDER_SITE_OTHER): Admitting: Physician Assistant

## 2023-08-07 VITALS — Ht 62.72 in | Wt 201.8 lb

## 2023-08-07 DIAGNOSIS — M1711 Unilateral primary osteoarthritis, right knee: Secondary | ICD-10-CM | POA: Diagnosis not present

## 2023-08-07 MED ORDER — LIDOCAINE HCL 1 % IJ SOLN
3.0000 mL | INTRAMUSCULAR | Status: AC | PRN
  Administered 2023-08-07: 3 mL

## 2023-08-07 MED ORDER — METHYLPREDNISOLONE ACETATE 40 MG/ML IJ SUSP
40.0000 mg | INTRAMUSCULAR | Status: AC | PRN
  Administered 2023-08-07: 40 mg via INTRA_ARTICULAR

## 2023-08-07 NOTE — Progress Notes (Signed)
 HPI: Sharon Michael comes in today due to right knee pain.  Last seen on 05/01/2023 knee was aspirated and injected and did well initially.  Again she has known tricompartmental arthritis of her right knee.  Last follow-up was diagnosed with a chronic right lower leg DVT and was placed on Eliquis.  She remains on Eliquis.  She is using a cane to ambulate no new injury to the knee.Still unable to take NSAIDs due to history of acute on chronic renal disease and currently being on Eliquis.  She does state that her primary care physician stated that she could go ahead with a knee replacement.  Review of systems: Negative for fevers chills.    Physical exam: General Well-developed well-nourished female no acute distress.  Right knee: No abnormal warmth erythema.  Positive effusion.  Overall good range of motion of the knee.  Patellofemoral crepitus with passive range of motion.  Tenderness along the medial lateral joint lines.  Impression: Right knee osteoarthritis On Eliquis secondary to DVT right lower leg  Plan: Discussed with her that she would need clearance from her primary care physician also have to be off of Eliquis 3 days prior to surgery.  She is given a note to give to her primary care physician to see if she is a candidate for surgery.  She will follow-up with [place] when she has clearance.  She understands to wait at least 3 months between cortisone injections.  She tolerated aspiration and injection of the right knee today.      Procedure Note  Patient: Sharon Michael             Date of Birth: December 27, 1945           MRN: [id number]             Visit Date: 08/07/2023  Procedures: Visit Diagnoses:  1. Unilateral primary osteoarthritis, right knee     Large Joint Inj: R knee on 08/07/2023 4:51 PM Details: superolateral approach Medications: 3 mL lidocaine 1 %; 40 mg methylPREDNISolone acetate 40 MG/ML Aspirate: 21 mL yellow Consent was given by the parent and patient. Immediately  prior to procedure a time out was called to verify the correct patient, procedure, equipment, support staff and site/side marked as required. Patient was prepped and draped in the usual sterile fashion.

## 2023-08-11 ENCOUNTER — Encounter: Payer: Self-pay | Admitting: Podiatry

## 2023-08-11 ENCOUNTER — Ambulatory Visit: Admitting: Podiatry

## 2023-08-11 DIAGNOSIS — B351 Tinea unguium: Secondary | ICD-10-CM | POA: Diagnosis not present

## 2023-08-11 DIAGNOSIS — E1140 Type 2 diabetes mellitus with diabetic neuropathy, unspecified: Secondary | ICD-10-CM | POA: Diagnosis not present

## 2023-08-11 DIAGNOSIS — I739 Peripheral vascular disease, unspecified: Secondary | ICD-10-CM

## 2023-08-11 NOTE — Progress Notes (Signed)
 This patient returns to my office for at risk foot care.  This patient requires this care by a professional since this patient will be at risk due to having type 2 diabetes.  Patient has painful callus left foot.  This patient is unable to cut nails herself since the patient cannot reach her nails.These nails are painful walking and wearing shoes. Patient has painful callus left foot. This patient presents for at risk foot care today.  General Appearance  Alert, conversant and in no acute stress.  Vascular  Dorsalis pedis and posterior tibial  pulses are  weakly palpable  bilaterally.  Capillary return is within normal limits  bilaterally. Temperature is within normal limits  bilaterally.  Neurologic  Senn-Weinstein monofilament wire test within normal limits  bilaterally. Muscle power within normal limits bilaterally.  Nails Thick disfigured discolored nails with subungual debris  from hallux to fifth toes bilaterally. No evidence of bacterial infection or drainage bilaterally.  Orthopedic  No limitations of motion  feet .  No crepitus or effusions noted.  No bony pathology or digital deformities noted.  Skin  normotropic skin with no porokeratosis noted bilaterally.  No signs of infections or ulcers noted.     Onychomycosis  Pain in right toes  Pain in left toes  Consent was obtained for treatment procedures.   Mechanical debridement of nails 1-5  bilaterally performed with a nail nipper.  Filed with dremel without incident.    Return office visit    12 weeks                 Told patient to return for periodic foot care and evaluation due to potential at risk complications.   Helane Gunther DPM

## 2023-11-10 NOTE — Progress Notes (Signed)
 Surgical Instructions   Your procedure is scheduled on November 23, 2023. Report to [facility] Main Entrance A at 6:30 A.M., then check in with the Admitting office. Any questions or running late day of surgery: call [phone]  Questions prior to your surgery date: call [phone], Monday-Friday, 8am-4pm. If you experience any cold or flu symptoms such as cough, fever, chills, shortness of breath, etc. between now and your scheduled surgery, please notify us  at the above number.     Remember:  Do not eat after midnight the night before your surgery  You may drink clear liquids until 5:30 the morning of your surgery.   Clear liquids allowed are: Water, Non-Citrus Juices (without pulp), Carbonated Beverages, Clear Tea (no milk, honey, etc.), Black Coffee Only (NO MILK, CREAM OR POWDERED CREAMER of any kind), and Gatorade.    Take these medicines the morning of surgery with A SIP OF WATER  allopurinol  (ZYLOPRIM )  amLODipine (NORVASC)  gabapentin  (NEURONTIN )  metoprolol  succinate (TOPROL -XL)        oxybutynin  (DITROPAN )   May take these medicines IF NEEDED: COLCRYS   oxyCODONE -acetaminophen  (PERCOCET/ROXICET)   apixaban  (ELIQUIS ) HOLD 3 DAYS PRIOR TO PROCEDURE LAST DOSE : November 19, 2023   WHAT DO I DO ABOUT MY DIABETES MEDICATION?  Do not take oral diabetes medicines metFORMIN (GLUCOPHAGE) the morning of surgery.  The day of surgery, do not take other diabetes injectables, including Byetta (exenatide), Bydureon (exenatide ER), Victoza (liraglutide), or Trulicity (dulaglutide).  If your CBG is greater than 220 mg/dL, you may take  of your sliding scale (correction) dose of insulin .   HOW TO MANAGE YOUR DIABETES BEFORE AND AFTER SURGERY  Why is it important to control my blood sugar before and after surgery? Improving blood sugar levels before and after surgery helps healing and can limit problems. A way of improving blood sugar control is eating a healthy diet by:  Eating  less sugar and carbohydrates  Increasing activity/exercise  Talking with your doctor about reaching your blood sugar goals High blood sugars (greater than 180 mg/dL) can raise your risk of infections and slow your recovery, so you will need to focus on controlling your diabetes during the weeks before surgery. Make sure that the doctor who takes care of your diabetes knows about your planned surgery including the date and location.  How do I manage my blood sugar before surgery? Check your blood sugar at least 4 times a day, starting 2 days before surgery, to make sure that the level is not too high or low.  Check your blood sugar the morning of your surgery when you wake up and every 2 hours until you get to the Short Stay unit.  If your blood sugar is less than 70 mg/dL, you will need to treat for low blood sugar: Do not take insulin . Treat a low blood sugar (less than 70 mg/dL) with  cup of clear juice (cranberry or apple), 4 glucose tablets, OR glucose gel. Recheck blood sugar in 15 minutes after treatment (to make sure it is greater than 70 mg/dL). If your blood sugar is not greater than 70 mg/dL on recheck, call [phone] for further instructions. Report your blood sugar to the short stay nurse when you get to Short Stay.  If you are admitted to the hospital after surgery: Your blood sugar will be checked by the staff and you will probably be given insulin  after surgery (instead of oral diabetes medicines) to make sure you have good blood sugar levels.  The goal for blood sugar control after surgery is 80-180 mg/dL.   One week prior to surgery, STOP taking any Aspirin (unless otherwise instructed by your surgeon) Aleve, Naproxen, Ibuprofen, Motrin, Advil, Goody's, BC's, all herbal medications, fish oil, and non-prescription vitamins.                     Do NOT Smoke (Tobacco/Vaping) for 24 hours prior to your procedure.  If you use a CPAP at night, you may bring your mask/headgear  for your overnight stay.   You will be asked to remove any contacts, glasses, piercing's, hearing aid's, dentures/partials prior to surgery. Please bring cases for these items if needed.    Patients discharged the day of surgery will not be allowed to drive home, and someone needs to stay with them for 24 hours.  SURGICAL WAITING ROOM VISITATION Patients may have no more than 2 support people in the waiting area - these visitors may rotate.   Pre-op nurse will coordinate an appropriate time for 1 ADULT support person, who may not rotate, to accompany patient in pre-op.  Children under the age of 56 must have an adult with them who is not the patient and must remain in the main waiting area with an adult.  If the patient needs to stay at the hospital during part of their recovery, the visitor guidelines for inpatient rooms apply.  Please refer to the [facility] website for the visitor guidelines for any additional information.   If you received a COVID test during your pre-op visit  it is requested that you wear a mask when out in public, stay away from anyone that may not be feeling well and notify your surgeon if you develop symptoms. If you have been in contact with anyone that has tested positive in the last 10 days please notify you surgeon.      Pre-operative 5 CHG Bathing Instructions   You can play a key role in reducing the risk of infection after surgery. Your skin needs to be as free of germs as possible. You can reduce the number of germs on your skin by washing with CHG (chlorhexidine gluconate) soap before surgery. CHG is an antiseptic soap that kills germs and continues to kill germs even after washing.   DO NOT use if you have an allergy to chlorhexidine/CHG or antibacterial soaps. If your skin becomes reddened or irritated, stop using the CHG and notify one of our RNs at [phone].   Please shower with the CHG soap starting 4 days before surgery using the following  schedule:     Please keep in mind the following:  DO NOT shave, including legs and underarms, starting the day of your first shower.   You may shave your face at any point before/day of surgery.  Place clean sheets on your bed the day you start using CHG soap. Use a clean washcloth (not used since being washed) for each shower. DO NOT sleep with pets once you start using the CHG.   CHG Shower Instructions:  Wash your face and private area with normal soap. If you choose to wash your hair, wash first with your normal shampoo.  After you use shampoo/soap, rinse your hair and body thoroughly to remove shampoo/soap residue.  Turn the water OFF and apply about 3 tablespoons (45 ml) of CHG soap to a CLEAN washcloth.  Apply CHG soap ONLY FROM YOUR NECK DOWN TO YOUR TOES (washing for 3-5 minutes)  DO  NOT use CHG soap on face, private areas, open wounds, or sores.  Pay special attention to the area where your surgery is being performed.  If you are having back surgery, having someone wash your back for you may be helpful. Wait 2 minutes after CHG soap is applied, then you may rinse off the CHG soap.  Pat dry with a clean towel  Put on clean clothes/pajamas   If you choose to wear lotion, please use ONLY the CHG-compatible lotions that are listed below.  Additional instructions for the day of surgery: DO NOT APPLY any lotions, deodorants, cologne, or perfumes.   Do not bring valuables to the hospital. [facility] is not responsible for any belongings/valuables. Do not wear nail polish, gel polish, artificial nails, or any other type of covering on natural nails (fingers and toes) Do not wear jewelry or makeup Put on clean/comfortable clothes.  Please brush your teeth.  Ask your nurse before applying any prescription medications to the skin.     CHG Compatible Lotions   Aveeno Moisturizing lotion  Cetaphil Moisturizing Cream  Cetaphil Moisturizing Lotion  Clairol Herbal Essence  Moisturizing Lotion, Dry Skin  Clairol Herbal Essence Moisturizing Lotion, Extra Dry Skin  Clairol Herbal Essence Moisturizing Lotion, Normal Skin  Curel Age Defying Therapeutic Moisturizing Lotion with Alpha Hydroxy  Curel Extreme Care Body Lotion  Curel Soothing Hands Moisturizing Hand Lotion  Curel Therapeutic Moisturizing Cream, Fragrance-Free  Curel Therapeutic Moisturizing Lotion, Fragrance-Free  Curel Therapeutic Moisturizing Lotion, Original Formula  Eucerin Daily Replenishing Lotion  Eucerin Dry Skin Therapy Plus Alpha Hydroxy Crme  Eucerin Dry Skin Therapy Plus Alpha Hydroxy Lotion  Eucerin Original Crme  Eucerin Original Lotion  Eucerin Plus Crme Eucerin Plus Lotion  Eucerin TriLipid Replenishing Lotion  Keri Anti-Bacterial Hand Lotion  Keri Deep Conditioning Original Lotion Dry Skin Formula Softly Scented  Keri Deep Conditioning Original Lotion, Fragrance Free Sensitive Skin Formula  Keri Lotion Fast Absorbing Fragrance Free Sensitive Skin Formula  Keri Lotion Fast Absorbing Softly Scented Dry Skin Formula  Keri Original Lotion  Keri Skin Renewal Lotion Keri Silky Smooth Lotion  Keri Silky Smooth Sensitive Skin Lotion  Nivea Body Creamy Conditioning Oil  Nivea Body Extra Enriched Lotion  Nivea Body Original Lotion  Nivea Body Sheer Moisturizing Lotion Nivea Crme  Nivea Skin Firming Lotion  NutraDerm 30 Skin Lotion  NutraDerm Skin Lotion  NutraDerm Therapeutic Skin Cream  NutraDerm Therapeutic Skin Lotion  ProShield Protective Hand Cream  Provon moisturizing lotion  Please read over the following fact sheets that you were given.

## 2023-11-13 ENCOUNTER — Encounter (HOSPITAL_COMMUNITY)
Admission: RE | Admit: 2023-11-13 | Discharge: 2023-11-13 | Disposition: A | Discharge status: Home / Self Care | Source: Ambulatory Visit | Attending: Orthopaedic Surgery | Admitting: Orthopaedic Surgery

## 2023-11-13 ENCOUNTER — Encounter (HOSPITAL_COMMUNITY): Payer: Self-pay

## 2023-11-13 ENCOUNTER — Other Ambulatory Visit: Payer: Self-pay

## 2023-11-13 VITALS — BP 149/76 | HR 57 | Temp 98.1°F | Resp 17 | Ht 65.0 in | Wt 201.6 lb

## 2023-11-13 DIAGNOSIS — M1711 Unilateral primary osteoarthritis, right knee: Secondary | ICD-10-CM | POA: Diagnosis not present

## 2023-11-13 DIAGNOSIS — Z01812 Encounter for preprocedural laboratory examination: Secondary | ICD-10-CM | POA: Diagnosis present

## 2023-11-13 DIAGNOSIS — Z01818 Encounter for other preprocedural examination: Secondary | ICD-10-CM

## 2023-11-13 HISTORY — DX: Acute embolism and thrombosis of unspecified deep veins of unspecified lower extremity: I82.409

## 2023-11-13 HISTORY — DX: Dependence on other enabling machines and devices: Z99.89

## 2023-11-13 HISTORY — DX: Unspecified glaucoma: H40.9

## 2023-11-13 HISTORY — DX: Gout, unspecified: M10.9

## 2023-11-13 LAB — SURGICAL PCR SCREEN
MRSA, PCR: NEGATIVE
Staphylococcus aureus: NEGATIVE

## 2023-11-13 LAB — CBC
HCT: 40.9 % (ref 36.0–46.0)
Hemoglobin: 13.2 g/dL (ref 12.0–15.0)
MCH: 29.6 pg (ref 26.0–34.0)
MCHC: 32.3 g/dL (ref 30.0–36.0)
MCV: 91.7 fL (ref 80.0–100.0)
Platelets: 163 10*3/uL (ref 150–400)
RBC: 4.46 MIL/uL (ref 3.87–5.11)
RDW: 15.2 % (ref 11.5–15.5)
WBC: 3.7 10*3/uL — ABNORMAL LOW (ref 4.0–10.5)
nRBC: 0 % (ref 0.0–0.2)

## 2023-11-13 LAB — BASIC METABOLIC PANEL WITH GFR
Anion gap: 9 (ref 5–15)
BUN: 28 mg/dL — ABNORMAL HIGH (ref 8–23)
CO2: 25 mmol/L (ref 22–32)
Calcium: 9.3 mg/dL (ref 8.9–10.3)
Chloride: 107 mmol/L (ref 98–111)
Creatinine, Ser: 1.33 mg/dL — ABNORMAL HIGH (ref 0.44–1.00)
GFR, Estimated: 41 mL/min — ABNORMAL LOW (ref >60)
Glucose, Bld: 107 mg/dL — ABNORMAL HIGH (ref 70–99)
Potassium: 4.2 mmol/L (ref 3.5–5.1)
Sodium: 141 mmol/L (ref 135–145)

## 2023-11-13 LAB — GLUCOSE, CAPILLARY: Glucose-Capillary: 99 mg/dL (ref 70–99)

## 2023-11-13 NOTE — Progress Notes (Signed)
 PCP - Dr Aliene Colon (Last OV 09/25/23) Cardiologist - none  Chest x-ray - n/a EKG - 03/03/23 Stress Test - n/a ECHO - n/a Cardiac Cath - n/a  ICD Pacemaker/Loop - n/a  Sleep Study -  n/a CPAP - none  Diabetes Type 2, does not check blood sugar  Do not take Metformin on the morning of surgery.  Blood Thinner Instructions:  Hold Eliquis  3 days prior to procedure.  Last dose will be on 11/19/23.  Aspirin Instructions: Patient is not taking ASA.    ERAS - clear liquids til 5:30 AM DOS PRE-SURGERY G2 drink given at PAT appointment with instructions.   Anesthesia review: Yes  STOP now taking any Aspirin (unless otherwise instructed by your surgeon), Aleve, Naproxen, Ibuprofen, Motrin, Advil, Goody's, BC's, all herbal medications, fish oil, and all vitamins.   Coronavirus Screening Do you have any of the following symptoms:  Cough yes/no: No Fever (>100.52F)  yes/no: No Runny nose yes/no: No Sore throat yes/no: No Difficulty breathing/shortness of breath  yes/no: No  Have you traveled in the last 14 days and where? yes/no: No  Patient verbalized understanding of instructions that were given to them at the PAT appointment. Patient was also instructed that they will need to review over the PAT instructions again at home before surgery.

## 2023-11-13 NOTE — Progress Notes (Signed)
 Surgical Instructions   Your procedure is scheduled on Thursday,November 23, 2023. Report to [facility] Main Entrance A at 6:30 A.M., then check in with the Admitting office. Any questions or running late day of surgery: call [phone]  Questions prior to your surgery date: call [phone], Monday-Friday, 8am-4pm. If you experience any cold or flu symptoms such as cough, fever, chills, shortness of breath, etc. between now and your scheduled surgery, please notify us  at the above number.     Remember:  Do not eat after midnight the night before your surgery Wed  You may drink clear liquids until 5:30 the morning of your surgery Thurs.   Clear liquids allowed are: Water, Non-Citrus Juices (without pulp), Carbonated Beverages, Clear Tea (no milk, honey, etc.), Black Coffee Only (NO MILK, CREAM OR POWDERED CREAMER of any kind), and Gatorade.  Please complete your PRE-SURGERY G2 drink that was provided to you by 5:30 AM the morning of surgery.  Please, if able, drink it in one setting. DO NOT SIP.  This will be the last thing that you will drink on day of surgery.    Take these medicines the morning of surgery with A SIP OF WATER  allopurinol  (ZYLOPRIM )  amLODipine (NORVASC)  gabapentin  (NEURONTIN )  metoprolol  succinate (TOPROL -XL)        oxybutynin  (DITROPAN )   May take these medicines IF NEEDED: COLCRYS   oxyCODONE -acetaminophen  (PERCOCET/ROXICET)   apixaban  (ELIQUIS ) HOLD 3 DAYS PRIOR TO PROCEDURE LAST DOSE : November 19, 2023   WHAT DO I DO ABOUT MY DIABETES MEDICATION?  Do not take oral diabetes medicines metFORMIN (GLUCOPHAGE) the morning of surgery.  The day of surgery, do not take other diabetes injectables, including Byetta (exenatide), Bydureon (exenatide ER), Victoza (liraglutide), or Trulicity (dulaglutide).  If your CBG is greater than 220 mg/dL, you may take  of your sliding scale (correction) dose of insulin .   HOW TO MANAGE YOUR DIABETES BEFORE AND AFTER  SURGERY  Why is it important to control my blood sugar before and after surgery? Improving blood sugar levels before and after surgery helps healing and can limit problems. A way of improving blood sugar control is eating a healthy diet by:  Eating less sugar and carbohydrates  Increasing activity/exercise  Talking with your doctor about reaching your blood sugar goals High blood sugars (greater than 180 mg/dL) can raise your risk of infections and slow your recovery, so you will need to focus on controlling your diabetes during the weeks before surgery. Make sure that the doctor who takes care of your diabetes knows about your planned surgery including the date and location.  How do I manage my blood sugar before surgery? Check your blood sugar at least 4 times a day, starting 2 days before surgery, to make sure that the level is not too high or low.  Check your blood sugar the morning of your surgery when you wake up and every 2 hours until you get to the Short Stay unit.  If your blood sugar is less than 70 mg/dL, you will need to treat for low blood sugar: Do not take insulin . Treat a low blood sugar (less than 70 mg/dL) with  cup of clear juice (cranberry or apple), 4 glucose tablets, OR glucose gel. Recheck blood sugar in 15 minutes after treatment (to make sure it is greater than 70 mg/dL). If your blood sugar is not greater than 70 mg/dL on recheck, call [phone] for further instructions. Report your blood sugar to the short stay nurse  when you get to Short Stay.  If you are admitted to the hospital after surgery: Your blood sugar will be checked by the staff and you will probably be given insulin  after surgery (instead of oral diabetes medicines) to make sure you have good blood sugar levels. The goal for blood sugar control after surgery is 80-180 mg/dL.   One week prior to surgery, STOP taking any Aspirin (unless otherwise instructed by your surgeon) Aleve, Naproxen,  Ibuprofen, Motrin, Advil, Goody's, BC's, all herbal medications, fish oil, and non-prescription vitamins.                     Do NOT Smoke (Tobacco/Vaping) for 24 hours prior to your procedure.  If you use a CPAP at night, you may bring your mask/headgear for your overnight stay.   You will be asked to remove any contacts, glasses, piercing's, hearing aid's, dentures/partials prior to surgery. Please bring cases for these items if needed.    Patients discharged the day of surgery will not be allowed to drive home, and someone needs to stay with them for 24 hours.  SURGICAL WAITING ROOM VISITATION Patients may have no more than 2 support people in the waiting area - these visitors may rotate.   Pre-op nurse will coordinate an appropriate time for 1 ADULT support person, who may not rotate, to accompany patient in pre-op.  Children under the age of 76 must have an adult with them who is not the patient and must remain in the main waiting area with an adult.  If the patient needs to stay at the hospital during part of their recovery, the visitor guidelines for inpatient rooms apply.  Please refer to the [facility] website for the visitor guidelines for any additional information.   If you received a COVID test during your pre-op visit  it is requested that you wear a mask when out in public, stay away from anyone that may not be feeling well and notify your surgeon if you develop symptoms. If you have been in contact with anyone that has tested positive in the last 10 days please notify you surgeon.      Pre-operative 5 CHG Bathing Instructions   You can play a key role in reducing the risk of infection after surgery. Your skin needs to be as free of germs as possible. You can reduce the number of germs on your skin by washing with CHG (chlorhexidine gluconate) soap before surgery. CHG is an antiseptic soap that kills germs and continues to kill germs even after washing.   DO NOT use if  you have an allergy to chlorhexidine/CHG or antibacterial soaps. If your skin becomes reddened or irritated, stop using the CHG and notify one of our RNs at [phone].   Please shower with the CHG soap starting 4 days before surgery using the following schedule:     Please keep in mind the following:  DO NOT shave, including legs and underarms, starting the day of your first shower.   You may shave your face at any point before/day of surgery.  Place clean sheets on your bed the day you start using CHG soap. Use a clean washcloth (not used since being washed) for each shower. DO NOT sleep with pets once you start using the CHG.   CHG Shower Instructions:  Wash your face and private area with normal soap. If you choose to wash your hair, wash first with your normal shampoo.  After you use  shampoo/soap, rinse your hair and body thoroughly to remove shampoo/soap residue.  Turn the water OFF and apply about 3 tablespoons (45 ml) of CHG soap to a CLEAN washcloth.  Apply CHG soap ONLY FROM YOUR NECK DOWN TO YOUR TOES (washing for 3-5 minutes)  DO NOT use CHG soap on face, private areas, open wounds, or sores.  Pay special attention to the area where your surgery is being performed.  If you are having back surgery, having someone wash your back for you may be helpful. Wait 2 minutes after CHG soap is applied, then you may rinse off the CHG soap.  Pat dry with a clean towel  Put on clean clothes/pajamas   If you choose to wear lotion, please use ONLY the CHG-compatible lotions that are listed below.  Additional instructions for the day of surgery: DO NOT APPLY any lotions, deodorants, cologne, or perfumes.   Do not bring valuables to the hospital. [facility] is not responsible for any belongings/valuables. Do not wear nail polish, gel polish, artificial nails, or any other type of covering on natural nails (fingers and toes) Do not wear jewelry or makeup Put on clean/comfortable clothes.   Please brush your teeth.  Ask your nurse before applying any prescription medications to the skin.     CHG Compatible Lotions   Aveeno Moisturizing lotion  Cetaphil Moisturizing Cream  Cetaphil Moisturizing Lotion  Clairol Herbal Essence Moisturizing Lotion, Dry Skin  Clairol Herbal Essence Moisturizing Lotion, Extra Dry Skin  Clairol Herbal Essence Moisturizing Lotion, Normal Skin  Curel Age Defying Therapeutic Moisturizing Lotion with Alpha Hydroxy  Curel Extreme Care Body Lotion  Curel Soothing Hands Moisturizing Hand Lotion  Curel Therapeutic Moisturizing Cream, Fragrance-Free  Curel Therapeutic Moisturizing Lotion, Fragrance-Free  Curel Therapeutic Moisturizing Lotion, Original Formula  Eucerin Daily Replenishing Lotion  Eucerin Dry Skin Therapy Plus Alpha Hydroxy Crme  Eucerin Dry Skin Therapy Plus Alpha Hydroxy Lotion  Eucerin Original Crme  Eucerin Original Lotion  Eucerin Plus Crme Eucerin Plus Lotion  Eucerin TriLipid Replenishing Lotion  Keri Anti-Bacterial Hand Lotion  Keri Deep Conditioning Original Lotion Dry Skin Formula Softly Scented  Keri Deep Conditioning Original Lotion, Fragrance Free Sensitive Skin Formula  Keri Lotion Fast Absorbing Fragrance Free Sensitive Skin Formula  Keri Lotion Fast Absorbing Softly Scented Dry Skin Formula  Keri Original Lotion  Keri Skin Renewal Lotion Keri Silky Smooth Lotion  Keri Silky Smooth Sensitive Skin Lotion  Nivea Body Creamy Conditioning Oil  Nivea Body Extra Enriched Lotion  Nivea Body Original Lotion  Nivea Body Sheer Moisturizing Lotion Nivea Crme  Nivea Skin Firming Lotion  NutraDerm 30 Skin Lotion  NutraDerm Skin Lotion  NutraDerm Therapeutic Skin Cream  NutraDerm Therapeutic Skin Lotion  ProShield Protective Hand Cream  Provon moisturizing lotion  Please read over the following fact sheets that you were given.

## 2023-11-14 ENCOUNTER — Encounter (HOSPITAL_COMMUNITY): Payer: Self-pay

## 2023-11-15 ENCOUNTER — Ambulatory Visit: Admitting: Podiatry

## 2023-11-22 NOTE — H&P (Signed)
 TOTAL KNEE ADMISSION H&P  Patient is being admitted for right total knee arthroplasty.  Subjective:  Chief Complaint:right knee pain.  HPI: Sharon Michael, 78 y.o. female, has a history of pain and functional disability in the right knee due to arthritis and has failed non-surgical conservative treatments for greater than 12 weeks to includeNSAID's and/or analgesics, corticosteriod injections, viscosupplementation injections, flexibility and strengthening excercises, use of assistive devices, weight reduction as appropriate, and activity modification.  Onset of symptoms was gradual, starting many years ago with gradually worsening course since that time. The patient noted no past surgery on the right knee(s).  Patient currently rates pain in the right knee(s) at 10 out of 10 with activity. Patient has night pain, worsening of pain with activity and weight bearing, pain that interferes with activities of daily living, pain with passive range of motion, crepitus, and joint swelling.  Patient has evidence of subchondral sclerosis, periarticular osteophytes, and joint space narrowing by imaging studies. There is no active infection.  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) 03/03/2023   Hypokalemia 03/03/2023   Acute renal failure superimposed on stage 3b chronic kidney disease (HCC) 03/03/2023   Benign essential HTN 03/03/2023   Gout 03/03/2023   Callus 02/05/2021   Unilateral primary osteoarthritis, right knee 07/16/2018   Chronic pain of left knee 02/08/2017   Unilateral primary osteoarthritis, left knee 02/08/2017   Type 2 diabetes, controlled, with neuropathy (HCC) 03/19/2013   Past Medical History:  Diagnosis Date   Ambulates with cane    straight   Diabetes mellitus without complication (HCC)    type 2, patient does not check blood sugar   DVT (deep venous thrombosis) (HCC)    chronic DVT of RLE   Glaucoma     Gout    Hyperlipidemia    Hypertension     Past Surgical History:  Procedure Laterality Date   ABDOMINAL HYSTERECTOMY     BREAST BIOPSY Right    COLONOSCOPY     EYE SURGERY Left    cataract removed    No current facility-administered medications for this encounter.   Current Outpatient Medications  Medication Sig Dispense Refill Last Dose/Taking   allopurinol  (ZYLOPRIM ) 100 MG tablet Take 100 mg by mouth daily.   Taking   amLODipine  (NORVASC ) 10 MG tablet Take 10 mg by mouth daily.   Taking   apixaban  (ELIQUIS ) 5 MG TABS tablet Take 2 tablets (10 mg total) by mouth 2 (two) times daily for 2 days, THEN 1 tablet (5 mg total) 2 (two) times daily. 60 tablet 12 Taking   atorvastatin  (LIPITOR) 40 MG tablet Take 40 mg by mouth daily.   Taking   metFORMIN  (GLUCOPHAGE ) 500 MG tablet Take 500 mg by mouth daily.   Taking   metoprolol  succinate (TOPROL -XL) 100 MG 24 hr tablet Take 100 mg by mouth daily.   Taking   oxybutynin  (DITROPAN ) 5 MG tablet Take 5 mg by mouth 2 (two) times daily.   Taking   ROCKLATAN  0.02-0.005 % SOLN Place 1 drop into both eyes at bedtime.   Taking   aspirin EC 81 MG tablet Take 81 mg by mouth daily. Swallow whole. (Patient not taking: Reported on 11/13/2023)   Not Taking   COLCRYS  0.6 MG tablet Take 1 tablet (0.6 mg total) by mouth 2 (two) times daily. Back down to once daily once acute pain in lower extremities (Patient not taking: Reported on  11/13/2023)   Not Taking   telmisartan-hydrochlorothiazide (MICARDIS HCT) 80-12.5 MG tablet Take 1 tablet by mouth daily. (Patient not taking: Reported on 11/13/2023)   Not Taking   No Known Allergies  Social History   Tobacco Use   Smoking status: Never   Smokeless tobacco: Never  Substance Use Topics   Alcohol use: No    Family History  Problem Relation Age of Onset   Kidney disease Mother    Hypertension Mother    Diabetes Mother    Heart disease Father    Hypertension Father    Diabetes Father    Kidney disease  Sister    Cancer Brother      Review of Systems  Objective:  Physical Exam Vitals reviewed.  Constitutional:      Appearance: Normal appearance.  HENT:     Head: Normocephalic and atraumatic.  Eyes:     Extraocular Movements: Extraocular movements intact.     Pupils: Pupils are equal, round, and reactive to light.  Cardiovascular:     Rate and Rhythm: Normal rate and regular rhythm.  Pulmonary:     Effort: Pulmonary effort is normal.     Breath sounds: Normal breath sounds.  Abdominal:     Palpations: Abdomen is soft.  Musculoskeletal:     Cervical back: Normal range of motion and neck supple.     Right knee: Effusion, bony tenderness and crepitus present. Decreased range of motion. Tenderness present over the medial joint line and lateral joint line. Abnormal alignment and abnormal meniscus.  Neurological:     Mental Status: She is alert and oriented to person, place, and time.  Psychiatric:        Behavior: Behavior normal.     Vital signs in last 24 hours:    Labs:   Estimated body mass index is 33.55 kg/m as calculated from the following:   Height as of 11/13/23: 5' 5 (1.651 m).   Weight as of 11/13/23: 91.4 kg.   Imaging Review Plain radiographs demonstrate severe degenerative joint disease of the right knee(s). The overall alignment ismild valgus. The bone quality appears to be good for age and reported activity level.      Assessment/Plan:  End stage arthritis, right knee   The patient history, physical examination, clinical judgment of the provider and imaging studies are consistent with end stage degenerative joint disease of the right knee(s) and total knee arthroplasty is deemed medically necessary. The treatment options including medical management, injection therapy arthroscopy and arthroplasty were discussed at length. The risks and benefits of total knee arthroplasty were presented and reviewed. The risks due to aseptic loosening, infection,  stiffness, patella tracking problems, thromboembolic complications and other imponderables were discussed. The patient acknowledged the explanation, agreed to proceed with the plan and consent was signed. Patient is being admitted for inpatient treatment for surgery, pain control, PT, OT, prophylactic antibiotics, VTE prophylaxis, progressive ambulation and ADL's and discharge planning. The patient is planning to be discharged home with home health services

## 2023-11-23 ENCOUNTER — Ambulatory Visit (HOSPITAL_COMMUNITY): Admitting: Anesthesiology

## 2023-11-23 ENCOUNTER — Observation Stay (HOSPITAL_COMMUNITY)
Admission: EM | Admit: 2023-11-23 | Discharge: 2023-11-27 | Disposition: A | Discharge status: Skilled Nursing Facility | Attending: Orthopaedic Surgery | Admitting: Orthopaedic Surgery

## 2023-11-23 ENCOUNTER — Ambulatory Visit (HOSPITAL_COMMUNITY): Payer: Self-pay | Admitting: Physician Assistant

## 2023-11-23 ENCOUNTER — Encounter (HOSPITAL_COMMUNITY)
Admission: EM | Disposition: A | Discharge status: Skilled Nursing Facility | Payer: Self-pay | Source: Home / Self Care | Attending: Orthopaedic Surgery

## 2023-11-23 ENCOUNTER — Encounter (HOSPITAL_COMMUNITY): Payer: Self-pay | Admitting: Orthopaedic Surgery

## 2023-11-23 ENCOUNTER — Observation Stay (HOSPITAL_COMMUNITY)

## 2023-11-23 ENCOUNTER — Other Ambulatory Visit: Payer: Self-pay

## 2023-11-23 DIAGNOSIS — M1711 Unilateral primary osteoarthritis, right knee: Principal | ICD-10-CM | POA: Diagnosis present

## 2023-11-23 DIAGNOSIS — Z7982 Long term (current) use of aspirin: Secondary | ICD-10-CM | POA: Insufficient documentation

## 2023-11-23 DIAGNOSIS — Z86718 Personal history of other venous thrombosis and embolism: Secondary | ICD-10-CM | POA: Insufficient documentation

## 2023-11-23 DIAGNOSIS — Z7901 Long term (current) use of anticoagulants: Secondary | ICD-10-CM | POA: Diagnosis not present

## 2023-11-23 DIAGNOSIS — E1122 Type 2 diabetes mellitus with diabetic chronic kidney disease: Secondary | ICD-10-CM | POA: Insufficient documentation

## 2023-11-23 DIAGNOSIS — I129 Hypertensive chronic kidney disease with stage 1 through stage 4 chronic kidney disease, or unspecified chronic kidney disease: Secondary | ICD-10-CM | POA: Insufficient documentation

## 2023-11-23 DIAGNOSIS — N1832 Chronic kidney disease, stage 3b: Secondary | ICD-10-CM

## 2023-11-23 DIAGNOSIS — Z7984 Long term (current) use of oral hypoglycemic drugs: Secondary | ICD-10-CM | POA: Diagnosis not present

## 2023-11-23 DIAGNOSIS — Z96651 Presence of right artificial knee joint: Secondary | ICD-10-CM

## 2023-11-23 DIAGNOSIS — Z79899 Other long term (current) drug therapy: Secondary | ICD-10-CM | POA: Insufficient documentation

## 2023-11-23 HISTORY — PX: TOTAL KNEE ARTHROPLASTY: SHX125

## 2023-11-23 LAB — GLUCOSE, CAPILLARY
Glucose-Capillary: 114 mg/dL — ABNORMAL HIGH (ref 70–99)
Glucose-Capillary: 93 mg/dL (ref 70–99)

## 2023-11-23 SURGERY — ARTHROPLASTY, KNEE, TOTAL
Anesthesia: Spinal | Site: Knee | Laterality: Right

## 2023-11-23 MED ORDER — PHENYLEPHRINE 80 MCG/ML (10ML) SYRINGE FOR IV PUSH (FOR BLOOD PRESSURE SUPPORT)
PREFILLED_SYRINGE | INTRAVENOUS | Status: DC | PRN
  Administered 2023-11-23: 80 ug via INTRAVENOUS

## 2023-11-23 MED ORDER — AMLODIPINE BESYLATE 10 MG PO TABS
10.0000 mg | ORAL_TABLET | Freq: Every day | ORAL | Status: DC
Start: 2023-11-24 — End: 2023-11-27
  Administered 2023-11-24 – 2023-11-27 (×4): 10 mg via ORAL
  Filled 2023-11-23 (×4): qty 1

## 2023-11-23 MED ORDER — LACTATED RINGERS IV SOLN: INTRAVENOUS | Status: DC

## 2023-11-23 MED ORDER — METHOCARBAMOL 1000 MG/10ML IJ SOLN: 500.0000 mg | Freq: Four times a day (QID) | INTRAMUSCULAR | Status: DC | PRN

## 2023-11-23 MED ORDER — HYDROMORPHONE HCL 1 MG/ML IJ SOLN: 0.5000 mg | INTRAMUSCULAR | Status: DC | PRN

## 2023-11-23 MED ORDER — CEFAZOLIN SODIUM-DEXTROSE 2-4 GM/100ML-% IV SOLN
2.0000 g | INTRAVENOUS | Status: AC
  Administered 2023-11-23: 2 g via INTRAVENOUS
  Filled 2023-11-23: qty 100

## 2023-11-23 MED ORDER — PROPOFOL 10 MG/ML IV BOLUS
INTRAVENOUS | Status: DC | PRN
Start: 2023-11-23 — End: 2023-11-23
  Administered 2023-11-23 (×2): 20 mg via INTRAVENOUS

## 2023-11-23 MED ORDER — POLYETHYLENE GLYCOL 3350 17 G PO PACK: 17.0000 g | PACK | Freq: Every day | ORAL | Status: DC | PRN

## 2023-11-23 MED ORDER — PHENYLEPHRINE HCL-NACL 20-0.9 MG/250ML-% IV SOLN
INTRAVENOUS | Status: DC | PRN
  Administered 2023-11-23: 20 ug/min via INTRAVENOUS

## 2023-11-23 MED ORDER — ACETAMINOPHEN 325 MG PO TABS
325.0000 mg | ORAL_TABLET | Freq: Four times a day (QID) | ORAL | Status: DC | PRN
  Administered 2023-11-24 – 2023-11-26 (×2): 650 mg via ORAL
  Filled 2023-11-23 (×2): qty 2

## 2023-11-23 MED ORDER — SODIUM CHLORIDE 0.9 % IV SOLN: INTRAVENOUS | Status: AC

## 2023-11-23 MED ORDER — GLYCOPYRROLATE 0.2 MG/ML IJ SOLN
INTRAMUSCULAR | Status: DC | PRN
Start: 2023-11-23 — End: 2023-11-23
  Administered 2023-11-23: .1 mg via INTRAVENOUS

## 2023-11-23 MED ORDER — FENTANYL CITRATE (PF) 250 MCG/5ML IJ SOLN
INTRAMUSCULAR | Status: DC | PRN
  Administered 2023-11-23: 25 ug via INTRAVENOUS

## 2023-11-23 MED ORDER — METFORMIN HCL 500 MG PO TABS
500.0000 mg | ORAL_TABLET | Freq: Every day | ORAL | Status: DC
  Administered 2023-11-23 – 2023-11-27 (×5): 500 mg via ORAL
  Filled 2023-11-23 (×5): qty 1

## 2023-11-23 MED ORDER — METOPROLOL SUCCINATE ER 100 MG PO TB24
100.0000 mg | ORAL_TABLET | Freq: Every day | ORAL | Status: DC
  Administered 2023-11-24 – 2023-11-27 (×4): 100 mg via ORAL
  Filled 2023-11-23 (×4): qty 1

## 2023-11-23 MED ORDER — PHENOL 1.4 % MT LIQD: 1.0000 | OROMUCOSAL | Status: DC | PRN

## 2023-11-23 MED ORDER — ROPIVACAINE HCL 5 MG/ML IJ SOLN
INTRAMUSCULAR | Status: DC | PRN
  Administered 2023-11-23: 30 mL via PERINEURAL

## 2023-11-23 MED ORDER — CLONIDINE HCL (ANALGESIA) 100 MCG/ML EP SOLN
EPIDURAL | Status: DC | PRN
  Administered 2023-11-23: 80 ug

## 2023-11-23 MED ORDER — OXYCODONE HCL 5 MG PO TABS
5.0000 mg | ORAL_TABLET | ORAL | Status: DC | PRN
  Administered 2023-11-23 – 2023-11-24 (×3): 10 mg via ORAL
  Filled 2023-11-23: qty 1
  Filled 2023-11-23 (×6): qty 2

## 2023-11-23 MED ORDER — DOCUSATE SODIUM 100 MG PO CAPS
100.0000 mg | ORAL_CAPSULE | Freq: Two times a day (BID) | ORAL | Status: DC
  Administered 2023-11-23 – 2023-11-27 (×9): 100 mg via ORAL
  Filled 2023-11-23 (×9): qty 1

## 2023-11-23 MED ORDER — ONDANSETRON HCL 4 MG/2ML IJ SOLN: 4.0000 mg | Freq: Four times a day (QID) | INTRAMUSCULAR | Status: DC | PRN

## 2023-11-23 MED ORDER — METOCLOPRAMIDE HCL 5 MG PO TABS: 5.0000 mg | ORAL_TABLET | Freq: Three times a day (TID) | ORAL | Status: DC | PRN

## 2023-11-23 MED ORDER — BUPIVACAINE-EPINEPHRINE (PF) 0.25% -1:200000 IJ SOLN
INTRAMUSCULAR | Status: DC | PRN
  Administered 2023-11-23: 30 mL

## 2023-11-23 MED ORDER — CHLORHEXIDINE GLUCONATE 0.12 % MT SOLN
OROMUCOSAL | Status: AC
  Administered 2023-11-23: 15 mL via OROMUCOSAL
  Filled 2023-11-23: qty 15

## 2023-11-23 MED ORDER — OXYCODONE HCL 5 MG PO TABS
10.0000 mg | ORAL_TABLET | ORAL | Status: DC | PRN
  Administered 2023-11-24: 15 mg via ORAL
  Administered 2023-11-25: 10 mg via ORAL
  Administered 2023-11-26: 15 mg via ORAL
  Administered 2023-11-26: 10 mg via ORAL
  Filled 2023-11-23 (×2): qty 3

## 2023-11-23 MED ORDER — DIPHENHYDRAMINE HCL 12.5 MG/5ML PO ELIX: 12.5000 mg | ORAL_SOLUTION | ORAL | Status: DC | PRN

## 2023-11-23 MED ORDER — PANTOPRAZOLE SODIUM 40 MG PO TBEC
40.0000 mg | DELAYED_RELEASE_TABLET | Freq: Every day | ORAL | Status: DC
  Administered 2023-11-23 – 2023-11-27 (×5): 40 mg via ORAL
  Filled 2023-11-23 (×5): qty 1

## 2023-11-23 MED ORDER — PROPOFOL 500 MG/50ML IV EMUL
INTRAVENOUS | Status: DC | PRN
  Administered 2023-11-23: 25 ug/kg/min via INTRAVENOUS

## 2023-11-23 MED ORDER — ORAL CARE MOUTH RINSE: 15.0000 mL | Freq: Once | OROMUCOSAL | Status: AC

## 2023-11-23 MED ORDER — NETARSUDIL-LATANOPROST 0.02-0.005 % OP SOLN
1.0000 [drp] | Freq: Every day | OPHTHALMIC | Status: DC
  Administered 2023-11-23 – 2023-11-26 (×3): 1 [drp] via OPHTHALMIC
  Filled 2023-11-23: qty 0.1

## 2023-11-23 MED ORDER — OXYBUTYNIN CHLORIDE 5 MG PO TABS
5.0000 mg | ORAL_TABLET | Freq: Two times a day (BID) | ORAL | Status: DC
  Administered 2023-11-23 – 2023-11-27 (×8): 5 mg via ORAL
  Filled 2023-11-23 (×8): qty 1

## 2023-11-23 MED ORDER — APIXABAN 2.5 MG PO TABS
2.5000 mg | ORAL_TABLET | Freq: Two times a day (BID) | ORAL | Status: DC
  Administered 2023-11-24 – 2023-11-27 (×7): 2.5 mg via ORAL
  Filled 2023-11-23 (×7): qty 1

## 2023-11-23 MED ORDER — LIDOCAINE 2% (20 MG/ML) 5 ML SYRINGE
INTRAMUSCULAR | Status: DC | PRN
  Administered 2023-11-23: 20 mg via INTRAVENOUS

## 2023-11-23 MED ORDER — ORAL CARE MOUTH RINSE: 15.0000 mL | OROMUCOSAL | Status: DC | PRN

## 2023-11-23 MED ORDER — PROPOFOL 1000 MG/100ML IV EMUL
INTRAVENOUS | Status: AC
  Filled 2023-11-23: qty 100

## 2023-11-23 MED ORDER — METOCLOPRAMIDE HCL 5 MG/ML IJ SOLN: 5.0000 mg | Freq: Three times a day (TID) | INTRAMUSCULAR | Status: DC | PRN

## 2023-11-23 MED ORDER — ALUM & MAG HYDROXIDE-SIMETH 200-200-20 MG/5ML PO SUSP: 30.0000 mL | ORAL | Status: DC | PRN

## 2023-11-23 MED ORDER — SODIUM CHLORIDE 0.9 % IR SOLN
Status: DC | PRN
  Administered 2023-11-23: 1000 mL

## 2023-11-23 MED ORDER — POVIDONE-IODINE 10 % EX SWAB
2.0000 | Freq: Once | CUTANEOUS | Status: AC
Start: 2023-11-23 — End: 2023-11-23
  Administered 2023-11-23: 2 via TOPICAL

## 2023-11-23 MED ORDER — CHLORHEXIDINE GLUCONATE 0.12 % MT SOLN: 15.0000 mL | Freq: Once | OROMUCOSAL | Status: AC

## 2023-11-23 MED ORDER — ALLOPURINOL 100 MG PO TABS
100.0000 mg | ORAL_TABLET | Freq: Every day | ORAL | Status: DC
Start: 2023-11-24 — End: 2023-11-27
  Administered 2023-11-24 – 2023-11-27 (×4): 100 mg via ORAL
  Filled 2023-11-23 (×4): qty 1

## 2023-11-23 MED ORDER — ONDANSETRON HCL 4 MG PO TABS: 4.0000 mg | ORAL_TABLET | Freq: Four times a day (QID) | ORAL | Status: DC | PRN

## 2023-11-23 MED ORDER — METHOCARBAMOL 500 MG PO TABS
500.0000 mg | ORAL_TABLET | Freq: Four times a day (QID) | ORAL | Status: DC | PRN
  Administered 2023-11-23 – 2023-11-26 (×5): 500 mg via ORAL
  Filled 2023-11-23 (×5): qty 1

## 2023-11-23 MED ORDER — CEFAZOLIN SODIUM-DEXTROSE 2-4 GM/100ML-% IV SOLN
2.0000 g | Freq: Four times a day (QID) | INTRAVENOUS | Status: AC
  Administered 2023-11-23: 2 g via INTRAVENOUS
  Filled 2023-11-23: qty 100

## 2023-11-23 MED ORDER — BUPIVACAINE-EPINEPHRINE (PF) 0.25% -1:200000 IJ SOLN
INTRAMUSCULAR | Status: AC
Start: 2023-11-23 — End: 2023-11-23
  Filled 2023-11-23: qty 30

## 2023-11-23 MED ORDER — EPHEDRINE SULFATE-NACL 50-0.9 MG/10ML-% IV SOSY
PREFILLED_SYRINGE | INTRAVENOUS | Status: DC | PRN
  Administered 2023-11-23 (×4): 2.5 mg via INTRAVENOUS

## 2023-11-23 MED ORDER — PROPOFOL 10 MG/ML IV BOLUS
INTRAVENOUS | Status: AC
  Filled 2023-11-23: qty 20

## 2023-11-23 MED ORDER — 0.9 % SODIUM CHLORIDE (POUR BTL) OPTIME
TOPICAL | Status: DC | PRN
  Administered 2023-11-23: 1000 mL

## 2023-11-23 MED ORDER — MENTHOL 3 MG MT LOZG
1.0000 | LOZENGE | OROMUCOSAL | Status: DC | PRN
Start: 2023-11-23 — End: 2023-11-27

## 2023-11-23 MED ORDER — FENTANYL CITRATE (PF) 250 MCG/5ML IJ SOLN
INTRAMUSCULAR | Status: AC
  Filled 2023-11-23: qty 5

## 2023-11-23 MED ORDER — DEXAMETHASONE SODIUM PHOSPHATE 4 MG/ML IJ SOLN
INTRAMUSCULAR | Status: DC | PRN
  Administered 2023-11-23: 5 mg via PERINEURAL

## 2023-11-23 MED ORDER — BUPIVACAINE IN DEXTROSE 0.75-8.25 % IT SOLN
INTRATHECAL | Status: DC | PRN
  Administered 2023-11-23: 1.8 mL via INTRATHECAL

## 2023-11-23 MED ORDER — TRANEXAMIC ACID-NACL 1000-0.7 MG/100ML-% IV SOLN
1000.0000 mg | INTRAVENOUS | Status: AC
  Administered 2023-11-23: 1000 mg via INTRAVENOUS
  Filled 2023-11-23: qty 100

## 2023-11-23 MED ORDER — LACTATED RINGERS IV SOLN: INTRAVENOUS | Status: DC | PRN

## 2023-11-23 SURGICAL SUPPLY — 59 items
BAG COUNTER SPONGE SURGICOUNT (BAG) ×1 IMPLANT
BANDAGE ESMARK 6X9 LF (GAUZE/BANDAGES/DRESSINGS) ×1 IMPLANT
BLADE SAG 18X100X1.27 (BLADE) ×1 IMPLANT
BNDG ELASTIC 6INX 5YD STR LF (GAUZE/BANDAGES/DRESSINGS) ×2 IMPLANT
BOWL SMART MIX CTS (DISPOSABLE) IMPLANT
CEMENT BONE R 1X40 (Cement) IMPLANT
COMPONET TIB PS KNEE E 0D RT (Joint) IMPLANT
COOLER ICEMAN CLASSIC (MISCELLANEOUS) ×1 IMPLANT
COVER SURGICAL LIGHT HANDLE (MISCELLANEOUS) ×1 IMPLANT
CUFF TOURN SGL QUICK 42 (TOURNIQUET CUFF) IMPLANT
CUFF TRNQT CYL 34X4.125X (TOURNIQUET CUFF) ×1 IMPLANT
DRAPE EXTREMITY T 121X128X90 (DISPOSABLE) ×1 IMPLANT
DRAPE HALF SHEET 40X57 (DRAPES) ×1 IMPLANT
DRAPE U-SHAPE 47X51 STRL (DRAPES) ×1 IMPLANT
DRSG XEROFORM 1X8 (GAUZE/BANDAGES/DRESSINGS) IMPLANT
DURAPREP 26ML APPLICATOR (WOUND CARE) ×1 IMPLANT
ELECT CAUTERY BLADE 6.4 (BLADE) ×1 IMPLANT
ELECTRODE REM PT RTRN 9FT ADLT (ELECTROSURGICAL) ×1 IMPLANT
FACESHIELD WRAPAROUND (MASK) ×2 IMPLANT
FACESHIELD WRAPAROUND OR TEAM (MASK) ×2 IMPLANT
FEMUR CMTD CR STD SZ 8 RT KNEE (Joint) IMPLANT
GAUZE PAD ABD 8X10 STRL (GAUZE/BANDAGES/DRESSINGS) ×1 IMPLANT
GAUZE SPONGE 4X4 12PLY STRL (GAUZE/BANDAGES/DRESSINGS) ×1 IMPLANT
GAUZE XEROFORM 1X8 LF (GAUZE/BANDAGES/DRESSINGS) ×1 IMPLANT
GLOVE BIOGEL PI IND STRL 8 (GLOVE) ×2 IMPLANT
GLOVE ORTHO TXT STRL SZ7.5 (GLOVE) ×1 IMPLANT
GLOVE SURG ORTHO 8.0 STRL STRW (GLOVE) ×1 IMPLANT
GOWN STRL REUS W/ TWL LRG LVL3 (GOWN DISPOSABLE) IMPLANT
GOWN STRL REUS W/ TWL XL LVL3 (GOWN DISPOSABLE) ×2 IMPLANT
IMMOBILIZER KNEE 22 UNIV (SOFTGOODS) ×1 IMPLANT
INSERT TIBIA KNEE RIGHT 10 (Joint) IMPLANT
IV NS 1000ML BAXH (IV SOLUTION) ×1 IMPLANT
KIT BASIN OR (CUSTOM PROCEDURE TRAY) ×1 IMPLANT
KIT TURNOVER KIT B (KITS) ×1 IMPLANT
MANIFOLD NEPTUNE II (INSTRUMENTS) ×1 IMPLANT
NDL 18GX1X1/2 (RX/OR ONLY) (NEEDLE) IMPLANT
NEEDLE 18GX1X1/2 (RX/OR ONLY) (NEEDLE) IMPLANT
NS IRRIG 1000ML POUR BTL (IV SOLUTION) ×1 IMPLANT
PACK TOTAL JOINT (CUSTOM PROCEDURE TRAY) ×1 IMPLANT
PAD ABD 8X10 STRL (GAUZE/BANDAGES/DRESSINGS) IMPLANT
PAD ARMBOARD POSITIONER FOAM (MISCELLANEOUS) ×1 IMPLANT
PAD COLD SHLDR WRAP-ON (PAD) ×1 IMPLANT
PADDING CAST COTTON 6X4 STRL (CAST SUPPLIES) ×1 IMPLANT
PENCIL BUTTON HOLSTER BLD 10FT (ELECTRODE) IMPLANT
PIN DRILL HDLS TROCAR 75 4PK (PIN) IMPLANT
SCREW FEMALE HEX FIX 25X2.5 (ORTHOPEDIC DISPOSABLE SUPPLIES) IMPLANT
SET HNDPC FAN SPRY TIP SCT (DISPOSABLE) ×1 IMPLANT
SET PAD KNEE POSITIONER (MISCELLANEOUS) ×1 IMPLANT
STAPLER SKIN PROX 35W (STAPLE) ×1 IMPLANT
STEM POLY PAT PLY 32M KNEE (Knees) IMPLANT
SUCTION TUBE FRAZIER 10FR DISP (SUCTIONS) ×1 IMPLANT
SUT VIC AB 0 CT1 27XBRD ANBCTR (SUTURE) ×1 IMPLANT
SUT VIC AB 1 CT1 27XBRD ANBCTR (SUTURE) ×2 IMPLANT
SUT VIC AB 2-0 CT1 TAPERPNT 27 (SUTURE) ×2 IMPLANT
SYR 50ML LL SCALE MARK (SYRINGE) IMPLANT
SYR CONTROL 10ML LL (SYRINGE) ×1 IMPLANT
TOWEL GREEN STERILE (TOWEL DISPOSABLE) ×1 IMPLANT
TOWEL GREEN STERILE FF (TOWEL DISPOSABLE) ×1 IMPLANT
TRAY CATH INTERMITTENT SS 16FR (CATHETERS) IMPLANT

## 2023-11-23 NOTE — Anesthesia Procedure Notes (Signed)
 Spinal  Patient location during procedure: OR Start time: 11/23/2023 7:48 AM End time: 11/23/2023 7:53 AM Reason for block: surgical anesthesia Staffing Anesthesiologist: Keneth Lynwood POUR, MD Performed by: Keneth Lynwood POUR, MD Authorized by: Keneth Lynwood POUR, MD   Preanesthetic Checklist Completed: patient identified, IV checked, site marked, risks and benefits discussed, surgical consent, monitors and equipment checked, pre-op evaluation and timeout performed Spinal Block Patient position: sitting Prep: DuraPrep Patient monitoring: heart rate, cardiac monitor, continuous pulse ox and blood pressure Approach: midline Location: L3-4 Injection technique: single-shot Needle Needle type: Sprotte  Needle gauge: 24 G Needle length: 9 cm Assessment Sensory level: T4 Events: CSF return

## 2023-11-23 NOTE — Anesthesia Procedure Notes (Signed)
 Anesthesia Regional Block: Adductor canal block   Pre-Anesthetic Checklist: , timeout performed,  Correct Patient, Correct Site, Correct Laterality,  Correct Procedure, Correct Position, site marked,  Risks and benefits discussed,  Surgical consent,  Pre-op evaluation,  At surgeon's request and post-op pain management  Laterality: Lower and Right  Prep: chloraprep       Needles:  Injection technique: Single-shot  Needle Type: Stimiplex     Needle Length: 9cm  Needle Gauge: 21     Additional Needles:   Procedures:,,,, ultrasound used (permanent image in chart),,    Narrative:  Start time: 11/23/2023 7:06 AM End time: 11/23/2023 7:26 AM Injection made incrementally with aspirations every 5 mL.  Performed by: Personally  Anesthesiologist: Darlyn Rush, MD  Additional Notes: BP cuff, EKG monitors applied. Sedation begun. Artery and nerve location verified with ultrasound. Anesthetic injected incrementally (5ml), slowly, and after negative aspirations under direct u/s guidance. Good fascial/perineural spread. Tolerated well.

## 2023-11-23 NOTE — Evaluation (Signed)
 Physical Therapy Evaluation Patient Details Name: Sharon Michael MRN: [id number] DOB: [birth year] Today's Date: 11/23/2023  History of Present Illness  Pt is a 78 y.o. female who presented 11/23/23 for elective R TKA. PMH includes DM, HTN, HLD, DVT, gout, glaucoma  Clinical Impression  Pt presents with condition above and deficits mentioned below, see PT Problem List. PTA, she was mod I intermittently utilizing a SPC for functional mobility. She lives alone in a 1-level house with 2 STE. Her son lives nearby but is limited in the amount of support he can provide due to requiring HD himself. Her sister is planning to come stay and assist her initially upon d/c, potentially for a week duration. Currently, the pt demonstrates limitations in R knee ROM, strength, and sensation, which is expected after having a TKA. She is limited by pain, which impacts her R leg weight bearing tolerance and balance. At this time, she is requiring supervision to transition supine to sit EOB, min-modA for transfers, and minA to ambulate up to ~10 ft with a RW. Follow physician's recommendations for discharge plan and follow up therapies. Will continue to follow acutely.      If plan is discharge home, recommend the following: A little help with walking and/or transfers;A little help with bathing/dressing/bathroom;Assistance with cooking/housework;Assist for transportation;Help with stairs or ramp for entrance   Can travel by private vehicle        Equipment Recommendations Rolling walker (2 wheels);Other (comment) (also needs the rubber end on her SPC replaced)  Recommendations for Other Services       Functional Status Assessment Patient has had a recent decline in their functional status and demonstrates the ability to make significant improvements in function in a reasonable and predictable amount of time.     Precautions / Restrictions Precautions Precautions: Fall Required Braces or Orthoses: Knee  Immobilizer - Right Knee Immobilizer - Right: Discontinue once straight leg raise with < 10 degree lag (assumed, no formal orders) Restrictions Weight Bearing Restrictions Per Provider Order: Yes RLE Weight Bearing Per Provider Order: Weight bearing as tolerated      Mobility  Bed Mobility Overal bed mobility: Needs Assistance Bed Mobility: Supine to Sit     Supine to sit: Supervision, HOB elevated, Used rails     General bed mobility comments: Extra time and use of rails to pull trunk up to sit and pivot hips to sit R EOB from elevated HOB, supervision for safety    Transfers Overall transfer level: Needs assistance Equipment used: Rolling walker (2 wheels) Transfers: Sit to/from Stand, Bed to chair/wheelchair/BSC Sit to Stand: Mod assist, Min assist   Step pivot transfers: Min assist       General transfer comment: Pt stood from elevated EOB with modA the first rep and progressed to only needing minA for the second rep from the recliner. Pt needed cues for hand placement, scooting to edge of seated surface, and kicking R leg anteriorly prior to sitting when performing transfers. MinA for balance to step pivot to R from bed to recliner with RW support. R KI donned throughout session except for AAROM while supine    Ambulation/Gait Ambulation/Gait assistance: Min assist Gait Distance (Feet): 10 Feet Assistive device: Rolling walker (2 wheels) Gait Pattern/deviations: Step-to pattern, Decreased stance time - right, Decreased weight shift to right, Decreased stride length, Trunk flexed, Antalgic Gait velocity: reduced Gait velocity interpretation: <1.31 ft/sec, indicative of household ambulator   General Gait Details: Pt ambulated with R KI donned. She  needed cues to sequence pushing through her arms on the RW to unweight her R leg during stance phase to then advance her L leg. She needed cues to sequence steps and RW progression as well. Noted good carryover as feedback faded.  MinA needed for balance throughout. Distance limited by pain.  Stairs            Wheelchair Mobility     Tilt Bed    Modified Rankin (Stroke Patients Only)       Balance Overall balance assessment: Needs assistance Sitting-balance support: No upper extremity supported, Feet supported Sitting balance-Leahy Scale: Fair Sitting balance - Comments: static sitting EOB with supervision for safety   Standing balance support: Bilateral upper extremity supported, During functional activity, Reliant on assistive device for balance Standing balance-Leahy Scale: Poor Standing balance comment: reliant on RW and minA for standing balance                             Pertinent Vitals/Pain Pain Assessment Pain Assessment: Faces Faces Pain Scale: Hurts even more Pain Location: R knee Pain Descriptors / Indicators: Discomfort, Guarding, Grimacing, Operative site guarding Pain Intervention(s): Limited activity within patient's tolerance, Monitored during session, Repositioned, Premedicated before session, Ice applied    Home Living Family/patient expects to be discharged to:: Private residence Living Arrangements: Alone Available Help at Discharge: Family;Available 24 hours/day (for first week sister plans to stay with her, after that son is available PRN but is limited by being on HD) Type of Home: House Home Access: Stairs to enter Entrance Stairs-Rails: None Entrance Stairs-Number of Steps: 2   Home Layout: One level Home Equipment: Cane - single point;Shower seat;BSC/3in1 (SPC bottom rubber is missing)      Prior Function Prior Level of Function : Independent/Modified Independent             Mobility Comments: Intermittent use of SPC, no recent falls in past 6 months ADLs Comments: has not driven or worked since October 2024     Extremity/Trunk Assessment   Upper Extremity Assessment Upper Extremity Assessment: Overall WFL for tasks assessed    Lower  Extremity Assessment Lower Extremity Assessment: RLE deficits/detail RLE Deficits / Details: R knee flexion AAROM = ~45' ; R knee extension AAROM = approximately -10'; reports numbness at knee and inferiorly, but able to detect touch still RLE Sensation: decreased light touch    Cervical / Trunk Assessment Cervical / Trunk Assessment: Normal  Communication   Communication Communication: No apparent difficulties    Cognition Arousal: Alert Behavior During Therapy: WFL for tasks assessed/performed   PT - Cognitive impairments: No apparent impairments                         Following commands: Intact       Cueing Cueing Techniques: Verbal cues     General Comments General comments (skin integrity, edema, etc.): provided pt with TKA HEP handout    Exercises Total Joint Exercises Quad Sets: AAROM, Right, 5 reps, Supine Heel Slides: AAROM, Right, 5 reps, Supine   Assessment/Plan    PT Assessment Patient needs continued PT services  PT Problem List Decreased strength;Decreased range of motion;Decreased activity tolerance;Decreased balance;Decreased mobility;Impaired sensation;Pain       PT Treatment Interventions DME instruction;Gait training;Stair training;Functional mobility training;Therapeutic activities;Therapeutic exercise;Balance training;Neuromuscular re-education;Patient/family education    PT Goals (Current goals can be found in the Care Plan section)  Acute Rehab  PT Goals Patient Stated Goal: to improve PT Goal Formulation: With patient Time For Goal Achievement: 11/30/23 Potential to Achieve Goals: Good    Frequency 7X/week     Co-evaluation               AM-PAC PT 6 Clicks Mobility  Outcome Measure Help needed turning from your back to your side while in a flat bed without using bedrails?: A Little Help needed moving from lying on your back to sitting on the side of a flat bed without using bedrails?: A Little Help needed moving to  and from a bed to a chair (including a wheelchair)?: A Little Help needed standing up from a chair using your arms (e.g., wheelchair or bedside chair)?: A Little Help needed to walk in hospital room?: Total (<20 ft) Help needed climbing 3-5 steps with a railing? : Total 6 Click Score: 14    End of Session Equipment Utilized During Treatment: Gait belt;Right knee immobilizer Activity Tolerance: Patient tolerated treatment well;Patient limited by pain Patient left: in chair;with call bell/phone within reach;with chair alarm set Nurse Communication: Mobility status PT Visit Diagnosis: Unsteadiness on feet (R26.81);Other abnormalities of gait and mobility (R26.89);Muscle weakness (generalized) (M62.81);Difficulty in walking, not elsewhere classified (R26.2);Pain Pain - Right/Left: Right Pain - part of body: Knee    Time: 8380-8342 PT Time Calculation (min) (ACUTE ONLY): 38 min   Charges:   PT Evaluation $PT Eval Low Complexity: 1 Low PT Treatments $Gait Training: 8-22 mins $Therapeutic Activity: 8-22 mins PT General Charges $$ ACUTE PT VISIT: 1 Visit         Theo Ferretti, PT, DPT Acute Rehabilitation Services  Office: [phone]   Theo CHRISTELLA Ferretti 11/23/2023, 5:17 PM

## 2023-11-23 NOTE — Anesthesia Postprocedure Evaluation (Signed)
 Anesthesia Post Note  Patient: Sharon Michael  Procedure(s) Performed: ARTHROPLASTY, KNEE, TOTAL (Right: Knee)     Patient location during evaluation: PACU Anesthesia Type: Spinal Level of consciousness: awake and alert Pain management: pain level controlled Vital Signs Assessment: post-procedure vital signs reviewed and stable Respiratory status: spontaneous breathing, nonlabored ventilation, respiratory function stable and patient connected to nasal cannula oxygen Cardiovascular status: blood pressure returned to baseline and stable Postop Assessment: no apparent nausea or vomiting Anesthetic complications: no   No notable events documented.  Last Vitals:  Vitals:   11/23/23 1221 11/23/23 1503  BP: (!) 147/81 135/80  Pulse: 69 81  Resp: 16 16  Temp: (!) 36.3 C 36.5 C  SpO2: 100% (!) 73%    Last Pain:  Vitals:   11/23/23 1221  TempSrc: Oral  PainSc: 0-No pain                 Lynwood MARLA Cornea

## 2023-11-23 NOTE — Plan of Care (Signed)

## 2023-11-23 NOTE — Transfer of Care (Signed)
 Immediate Anesthesia Transfer of Care Note  Patient: Sharon Michael  Procedure(s) Performed: ARTHROPLASTY, KNEE, TOTAL (Right: Knee)  Patient Location: PACU  Anesthesia Type:Regional and Spinal  Level of Consciousness: awake, alert , and oriented  Airway & Oxygen Therapy: Patient Spontanous Breathing and Patient connected to nasal cannula oxygen  Post-op Assessment: Report given to RN and Post -op Vital signs reviewed and stable  Post vital signs: Reviewed and stable  Last Vitals:  Vitals Value Taken Time  BP 127/85 11/23/23 10:00  Temp    Pulse 65 11/23/23 10:02  Resp 20 11/23/23 10:02  SpO2 98 % 11/23/23 10:02  Vitals shown include unfiled device data.  Last Pain:  Vitals:   11/23/23 0636  TempSrc:   PainSc: 0-No pain         Complications: No notable events documented.

## 2023-11-23 NOTE — Anesthesia Procedure Notes (Signed)
 Procedure Name: MAC Date/Time: 11/23/2023 7:54 AM  Performed by: Vertie Arthea RAMAN, CRNAPre-anesthesia Checklist: Emergency Drugs available, Patient identified, Suction available, Patient being monitored and Timeout performed Patient Re-evaluated:Patient Re-evaluated prior to induction Oxygen Delivery Method: Simple face mask

## 2023-11-23 NOTE — Anesthesia Preprocedure Evaluation (Addendum)
 Anesthesia Evaluation  Patient identified by MRN, date of birth, ID band Patient awake    Reviewed: Allergy & Precautions, NPO status , Patient's Chart, lab work & pertinent test results, reviewed documented beta blocker date and time   History of Anesthesia Complications Negative for: history of anesthetic complications  Airway Mallampati: II  TM Distance: >3 FB     Dental no notable dental hx.    Pulmonary neg pulmonary ROS   breath sounds clear to auscultation       Cardiovascular hypertension, + DVT   Rhythm:Regular Rate:Normal     Neuro/Psych negative neurological ROS     GI/Hepatic negative GI ROS, Neg liver ROS,,,  Endo/Other  diabetes    Renal/GU Renal disease     Musculoskeletal  (+) Arthritis ,    Abdominal   Peds  Hematology negative hematology ROS (+)   Anesthesia Other Findings   Reproductive/Obstetrics                              Anesthesia Physical Anesthesia Plan  ASA: 3  Anesthesia Plan: Spinal   Post-op Pain Management: Regional block*   Induction:   PONV Risk Score and Plan: 3 and Ondansetron , Dexamethasone  and Treatment may vary due to age or medical condition  Airway Management Planned: Natural Airway  Additional Equipment:   Intra-op Plan:   Post-operative Plan:   Informed Consent: I have reviewed the patients History and Physical, chart, labs and discussed the procedure including the risks, benefits and alternatives for the proposed anesthesia with the patient or authorized representative who has indicated his/her understanding and acceptance.     Dental advisory given  Plan Discussed with: CRNA  Anesthesia Plan Comments: (Risks of anesthesia explained at length. This includes, but is not limited to, sore throat, damage to teeth, lips gums, tongue and vocal cords, nausea and vomiting, reactions to medications, stroke, heart attack, and death.  All patient questions were answered and the patient wishes to proceed. Risks of peripheral nerve block and spinal explained at length. This includes, but is not limited to, bleeding, infection, reactions to the medications, seizures, damage to surrounding structures, damage to nerves, permanent weakness, numbness, tingling and pain. All patient questions were answered and patient wishes to proceed with nerve block and spinal. )         Anesthesia Quick Evaluation

## 2023-11-23 NOTE — Interval H&P Note (Signed)
 History and Physical Interval Note: The patient understands that she is here today for right total knee replacement to treat significant right knee arthritis and pain.  There is been no acute interval change in her medical status.  The risks and benefits of surgery have been discussed in detail and informed consents been obtained.  The right operative knee has been marked.  11/23/2023 7:11 AM  Sharon Michael  has presented today for surgery, with the diagnosis of Right knee osteoarthritis.  The various methods of treatment have been discussed with the patient and family. After consideration of risks, benefits and other options for treatment, the patient has consented to  Procedure(s): ARTHROPLASTY, KNEE, TOTAL (Right) as a surgical intervention.  The patient's history has been reviewed, patient examined, no change in status, stable for surgery.  I have reviewed the patient's chart and labs.  Questions were answered to the patient's satisfaction.     [name]

## 2023-11-23 NOTE — Op Note (Signed)
 Operative Note  Date of operation: 11/23/2023 Preoperative diagnosis: Right knee primary osteoarthritis Postoperative diagnosis: Same  Procedure: Right cemented total knee arthroplasty  Implants: Biomet/Zimmer persona cemented knee system Implant Name Type Inv. Item Serial No. Manufacturer Lot No. LRB No. Used Action  CEMENT BONE R 1X40 - [id number] Cement CEMENT BONE R 1X40  [employer]IM[employer](ORTH,TRAU,BIO,SG) [id number] Right 1 Implanted  CEMENT BONE R 1X40 - [id number] Cement CEMENT BONE R 1X40  [employer](ORTH,TRAU,BIO,SG) [id number] Right 1 Implanted  STEM POLY PAT PLY 33M KNEE - [id number] Knees STEM POLY PAT PLY 33M KNEE  [employer]ON(ORTH,TRAU,BIO,SG) [id number] Right 1 Implanted  INSERT TIBIA KNEE RIGHT 10 - [id number] Joint INSERT TIBIA KNEE RIGHT 10  [employer]ON(ORTH,TRAU,BIO,SG) [id number] Right 1 Implanted  COMPONET TIB PS KNEE E 0D RT - [id number] Joint COMPONET TIB PS KNEE E 0D RT  [employer]ON(ORTH,TRAU,BIO,SG) [id number] Right 1 Implanted  FEMUR CMTD CR STD SZ 8 RT KNEE - [id number] Joint FEMUR CMTD CR STD SZ 8 RT KNEE  [employer]ON(ORTH,TRAU,BIO,SG) [id number] Right 1 Implanted   Surgeon: Lonni GRADE. Vernetta, MD Assistant: Tory Gaskins, PA-C  Anesthesia: #1 right lower extremity adductor canal block, #2 spinal, #3 local Antibiotics: IV Ancef  Tourniquet time: Under 1 hour EBL: Less than 50 cc Complications: None  Indications: The patient is a 78 year old female with debilitating arthritis involving her right knee.  She has tried and failed conservative treatment for over a year.  Her knee does have valgus malalignment with x-rays showing bone-on-bone wear.  At this point her right knee pain is daily and it is detrimentally vetting her mobility, her quality of life and her actives daily living to the point she does wish to proceed with a knee replacement on the right side.  We agree with this as well based on her clinical exam and x-ray findings and the very conservative  treatment.  We did discuss the risks of acute blood loss anemia, nerve and vessel injury, infection, DVT, implant failure and wound healing issues.  She understands that our goals are hopefully decreased pain, improved mobility and improved quality of life.  Procedure description: After informed consent was obtained and the appropriate right knee was marked, anesthesia obtained a right lower extremity adductor canal block in the holding room.  The patient was then brought to the operating room and set up on the operating table where spinal anesthesia was obtained.  She was laid in supine position on the operating table and a Foley catheter was placed.  A nonsterile tourniquet placed around her upper right thigh and her right thigh, knee, leg, and ankle were prepped and draped with DuraPrep and sterile drapes including a sterile stockinette.  A timeout was called and she was identified as the correct patient and the correct right knee.  An Esmarch was then used to wrap of the leg and the tourniquet was inflated to 300 mm of pressure.  With the knee extended a direct midline incision was made over the patella and carried proximally distally.  Dissection was carried down to the knee joint and a medial parapatellar arthrotomy was made finding a moderate joint effusion.  With the knee in a flexed position we found complete cartilage wear the lateral compartment the knee as well as medial and patellofemoral joint.  Osteophytes removed from all 3 compartments as well as the remnants of the ACL and medial lateral meniscus.  We then used an extramedullary cutting guide for making our proximal tibia cut correcting for varus and valgus and a  7 degree slope.  We made this cut to take 2 mm off the low side.  We made the cut without difficulty.  We then went to the femur and used an intramedullary based cutting guide through the notch of the femur for distal femur cut setting this for a right knee at 5 degrees actually rotated  and a 10 mm distal femoral cut.  The brought the knee back down to full extension and with a 10 mm extension block at achieve full extension.  We then impacted the femur and put a femoral sizing guide based off the epicondylar axis.  Based off of this we chose a size 8 femur.  We put a 4-in-1 cutting block for a size 8 femur and made our anterior and posterior cuts followed our chamfer cuts.  We then backed the tibia and chose a size E tibial tray for a right knee with coverage over the tibial plateau so the rotation off the tibial tubercle and the femur.  We made our drill hole and keel punch off of this.  We then trialed our size E right tibia combined with our size 8 right CR standard femur.  We trialed a 10 mm thickness right medial congruent polyethylene insert we are pleased with the range of motion and stability without answer.  We then made our patella cut and drilled 3 holes for a size 32 patella button.  With all trial instrumentation in the knee we replaced the range of motion and stability.  We then removed all trial and station from the knee and irrigated the knee with normal saline solution.  We then placed Marcaine  with epinephrine  around the arthrotomy.  Next the cement was mixed in with the knee in a flexed position we cemented our Biomet/Zimmer persona tibial tray for a right knee size E followed by cementing our size 8 CR standard femur.  We placed our 10 mm thickness right medial congruent polythene insert and cemented our 32 mm patella button.  We then held the knee fully extended and compressed while the cemented hardened.  Once that it hardened the tourniquet was let down and hemostasis was obtained with electrocautery.  The arthrotomy was then closed with interrupted #1 Vicryl suture followed by 0 Vicryl goes deep tissue and 2-0 Vicryl to close subcutaneous tissue.  The skin was closed with staples.  Well-padded sterile dressing was applied.  The patient was taken to the recovery room.  Tory Gaskins, PA-C did assist in the entire case and beginning the end and his assistance was crucial and medically necessary for soft tissue management and retraction, helping guide implant placement and a layered closure of the wound.

## 2023-11-23 NOTE — Discharge Instructions (Signed)

## 2023-11-24 ENCOUNTER — Encounter (HOSPITAL_COMMUNITY): Payer: Self-pay | Admitting: Orthopaedic Surgery

## 2023-11-24 DIAGNOSIS — M1711 Unilateral primary osteoarthritis, right knee: Secondary | ICD-10-CM | POA: Diagnosis not present

## 2023-11-24 LAB — CBC
HCT: 34 % — ABNORMAL LOW (ref 36.0–46.0)
Hemoglobin: 11.3 g/dL — ABNORMAL LOW (ref 12.0–15.0)
MCH: 30.1 pg (ref 26.0–34.0)
MCHC: 33.2 g/dL (ref 30.0–36.0)
MCV: 90.4 fL (ref 80.0–100.0)
Platelets: 157 K/uL (ref 150–400)
RBC: 3.76 MIL/uL — ABNORMAL LOW (ref 3.87–5.11)
RDW: 15.1 % (ref 11.5–15.5)
WBC: 6 K/uL (ref 4.0–10.5)
nRBC: 0 % (ref 0.0–0.2)

## 2023-11-24 LAB — BASIC METABOLIC PANEL WITH GFR
Anion gap: 9 (ref 5–15)
BUN: 27 mg/dL — ABNORMAL HIGH (ref 8–23)
CO2: 23 mmol/L (ref 22–32)
Calcium: 8.7 mg/dL — ABNORMAL LOW (ref 8.9–10.3)
Chloride: 106 mmol/L (ref 98–111)
Creatinine, Ser: 1.21 mg/dL — ABNORMAL HIGH (ref 0.44–1.00)
GFR, Estimated: 46 mL/min — ABNORMAL LOW (ref >60)
Glucose, Bld: 129 mg/dL — ABNORMAL HIGH (ref 70–99)
Potassium: 4 mmol/L (ref 3.5–5.1)
Sodium: 138 mmol/L (ref 135–145)

## 2023-11-24 NOTE — Progress Notes (Signed)
 Subjective: 1 Day Post-Op Procedure(s) (LRB): ARTHROPLASTY, KNEE, TOTAL (Right) Patient reports pain as moderate.    Objective: Vital signs in last 24 hours: Temp:  [97.4 F (36.3 C)-98.2 F (36.8 C)] 98.2 F (36.8 C) (07/11 0349) Pulse Rate:  [59-81] 76 (07/11 0349) Resp:  [12-17] 17 (07/11 0349) BP: (125-158)/(73-85) 139/81 (07/11 0349) SpO2:  [73 %-100 %] 100 % (07/11 0349)  Intake/Output from previous day: 07/10 0701 - 07/11 0700 In: 1748.4 [P.O.:480; I.V.:1068.4; IV Piggyback:200] Out: 2200 [Urine:2125; Blood:75] Intake/Output this shift: Total I/O In: 240 [P.O.:240] Out: 500 [Urine:500]  No results for input(s): HGB in the last 72 hours. No results for input(s): WBC, RBC, HCT, PLT in the last 72 hours. No results for input(s): NA, K, CL, CO2, BUN, CREATININE, GLUCOSE, CALCIUM  in the last 72 hours. No results for input(s): LABPT, INR in the last 72 hours.  Sensation intact distally Intact pulses distally Dorsiflexion/Plantar flexion intact Incision: dressing C/D/I Compartment soft   Assessment/Plan: 1 Day Post-Op Procedure(s) (LRB): ARTHROPLASTY, KNEE, TOTAL (Right) Up with therapy      [name] 11/24/2023, 6:28 AM

## 2023-11-24 NOTE — Progress Notes (Signed)
 Physical Therapy Treatment Patient Details Name: Sharon Michael MRN: [id number] DOB: 07/30/1945 Today's Date: 11/24/2023   History of Present Illness Pt is a 78 y.o. female who presented 11/23/23 for elective R TKA. PMH includes DM, HTN, HLD, DVT, gout, glaucoma    PT Comments  AM session: Pt resting in bed on arrival, agreeable to session, however with slow progress due to pain limiting weight bearing tolerance, despite pre-medication prior to session. Pt giving good effort for transfers and gait training however continues to require heavy mod A to boost to stand with increased time for motor planning. Pt ambulating ~15' with RW for support and min A to maintain balance this session with distance limited by pain. Pt agreeable to time up in chair at end of session with ice applied at knee. Will plan to continue to progress gait training and initiate stair training in PM session. Pt continues to benefit from skilled PT services to progress toward functional mobility goals. Follow physician's recommendations for discharge plan and follow up therapies    If plan is discharge home, recommend the following: A little help with walking and/or transfers;A little help with bathing/dressing/bathroom;Assistance with cooking/housework;Assist for transportation;Help with stairs or ramp for entrance   Can travel by private vehicle        Equipment Recommendations  Rolling walker (2 wheels);Other (comment) (also needs the rubber end on her SPC replaced)    Recommendations for Other Services       Precautions / Restrictions Precautions Precautions: Fall Required Braces or Orthoses: Knee Immobilizer - Right Knee Immobilizer - Right: Discontinue once straight leg raise with < 10 degree lag (assumed, no formal orders) Restrictions Weight Bearing Restrictions Per Provider Order: Yes RLE Weight Bearing Per Provider Order: Weight bearing as tolerated     Mobility  Bed Mobility Overal bed mobility: Needs  Assistance Bed Mobility: Supine to Sit     Supine to sit: HOB elevated, Used rails, Min assist     General bed mobility comments: Extra time and use of rails to pull trunk up to sit and pivot hips to sit R EOB from elevated HOB, min A to mobilize RLE    Transfers Overall transfer level: Needs assistance Equipment used: Rolling walker (2 wheels) Transfers: Sit to/from Stand, Bed to chair/wheelchair/BSC Sit to Stand: Mod assist           General transfer comment: Pt stood from elevated EOB with heavy modA and increased time. Pt needed cues for hand placement, scooting to edge of seated surface, and kicking R leg anteriorly prior to sitting when performing transfers.    Ambulation/Gait Ambulation/Gait assistance: Min assist Gait Distance (Feet): 15 Feet Assistive device: Rolling walker (2 wheels) Gait Pattern/deviations: Step-to pattern, Decreased stance time - right, Decreased weight shift to right, Decreased stride length, Trunk flexed, Antalgic Gait velocity: reduced     General Gait Details: Pt ambulated with R KI donned. She needed cues to sequence pushing through her arms on the RW to [facility] her R leg during stance phase to then advance her L leg. She needed cues to sequence steps and RW progression as well. Noted good carryover as feedback faded. MinA needed for balance throughout. Distance limited by pain and fatigue   Stairs             Wheelchair Mobility     Tilt Bed    Modified Rankin (Stroke Patients Only)       Balance Overall balance assessment: Needs assistance Sitting-balance support: No upper  extremity supported, Feet supported Sitting balance-Leahy Scale: Fair Sitting balance - Comments: static sitting EOB with supervision for safety   Standing balance support: Bilateral upper extremity supported, During functional activity, Reliant on assistive device for balance Standing balance-Leahy Scale: Poor Standing balance comment: reliant on RW  and minA for standing balance                            Communication Communication Communication: No apparent difficulties  Cognition Arousal: Alert Behavior During Therapy: WFL for tasks assessed/performed   PT - Cognitive impairments: No apparent impairments                         Following commands: Intact      Cueing Cueing Techniques: Verbal cues  Exercises Total Joint Exercises Ankle Circles/Pumps: AROM, Both, 10 reps, Seated    General Comments        Pertinent Vitals/Pain Pain Assessment Pain Assessment: Faces Faces Pain Scale: Hurts even more Pain Location: R knee Pain Descriptors / Indicators: Discomfort, Guarding, Grimacing, Operative site guarding Pain Intervention(s): Premedicated before session, Monitored during session, Limited activity within patient's tolerance, Ice applied    Home Living                          Prior Function            PT Goals (current goals can now be found in the care plan section) Acute Rehab PT Goals Patient Stated Goal: to improve PT Goal Formulation: With patient Time For Goal Achievement: 11/30/23 Progress towards PT goals: Progressing toward goals (slowly)    Frequency    7X/week      PT Plan      Co-evaluation              AM-PAC PT 6 Clicks Mobility   Outcome Measure  Help needed turning from your back to your side while in a flat bed without using bedrails?: A Little Help needed moving from lying on your back to sitting on the side of a flat bed without using bedrails?: A Little Help needed moving to and from a bed to a chair (including a wheelchair)?: A Little Help needed standing up from a chair using your arms (e.g., wheelchair or bedside chair)?: A Little Help needed to walk in hospital room?: Total (<20 ft) Help needed climbing 3-5 steps with a railing? : Total 6 Click Score: 14    End of Session Equipment Utilized During Treatment: Gait belt;Right knee  immobilizer Activity Tolerance: Patient tolerated treatment well;Patient limited by pain Patient left: in chair;with call bell/phone within reach;with chair alarm set Nurse Communication: Mobility status PT Visit Diagnosis: Unsteadiness on feet (R26.81);Other abnormalities of gait and mobility (R26.89);Muscle weakness (generalized) (M62.81);Difficulty in walking, not elsewhere classified (R26.2);Pain Pain - Right/Left: Right Pain - part of body: Knee     Time: 9141-9072 PT Time Calculation (min) (ACUTE ONLY): 29 min  Charges:    $Gait Training: 8-22 mins $Therapeutic Activity: 8-22 mins PT General Charges $$ ACUTE PT VISIT: 1 Visit                     Elster Corbello R. PTA Acute Rehabilitation Services Office: [phone]   Therisa CHRISTELLA Boor 11/24/2023, 10:07 AM

## 2023-11-24 NOTE — Progress Notes (Signed)
 Physical Therapy Treatment Patient Details Name: Sharon Michael MRN: [id number] DOB: 05-21-1945 Today's Date: 11/24/2023   History of Present Illness Pt is a 78 y.o. female who presented 11/23/23 for elective R TKA. PMH includes DM, HTN, HLD, DVT, gout, glaucoma    PT Comments  PM session, pt in process of returning to bed with NT and RN on arrival, needing mod A to bring Les into bed. Pt declining OOB mobility due to fatigue and pain and having jsut returned to bed from being up in the chair most of the day. Pt able to perform bed level HEP exercises with hands on assist for increased ROM due to RLE weakness. Pt with slow progress towards acute goals. Follow physician's recommendations for discharge plan and follow up therapies, however pending pt progress pt may benefit from continued inpatient follow up therapy, <3 hours/day to address deficits and maximize functional independence and safety as pt from home alone.     If plan is discharge home, recommend the following: A little help with walking and/or transfers;A little help with bathing/dressing/bathroom;Assistance with cooking/housework;Assist for transportation;Help with stairs or ramp for entrance   Can travel by private vehicle        Equipment Recommendations  Rolling walker (2 wheels);Other (comment) (also needs the rubber end on her SPC replaced)    Recommendations for Other Services       Precautions / Restrictions Precautions Precautions: Fall Required Braces or Orthoses: Knee Immobilizer - Right Knee Immobilizer - Right: Discontinue once straight leg raise with < 10 degree lag (assumed, no formal orders) Restrictions Weight Bearing Restrictions Per Provider Order: Yes RLE Weight Bearing Per Provider Order: Weight bearing as tolerated     Mobility  Bed Mobility Overal bed mobility: Needs Assistance Bed Mobility: Sit to Supine     Supine to sit: HOB elevated, Used rails, Min assist Sit to supine: Mod assist    General bed mobility comments: mod A to bring LE into bed    Transfers Overall transfer level: Needs assistance Equipment used: Rolling walker (2 wheels) Transfers: Sit to/from Stand, Bed to chair/wheelchair/BSC Sit to Stand: Mod assist           General transfer comment: pt returning to bed on arrival    Ambulation/Gait Ambulation/Gait assistance: Min assist Gait Distance (Feet): 15 Feet Assistive device: Rolling walker (2 wheels) Gait Pattern/deviations: Step-to pattern, Decreased stance time - right, Decreased weight shift to right, Decreased stride length, Trunk flexed, Antalgic Gait velocity: reduced     General Gait Details: pt declining this session due to pain and having just gotten back to bed   Stairs             Wheelchair Mobility     Tilt Bed    Modified Rankin (Stroke Patients Only)       Balance Overall balance assessment: Needs assistance Sitting-balance support: No upper extremity supported, Feet supported Sitting balance-Leahy Scale: Fair Sitting balance - Comments: static sitting EOB with supervision for safety   Standing balance support: Bilateral upper extremity supported, During functional activity, Reliant on assistive device for balance Standing balance-Leahy Scale: Poor Standing balance comment: reliant on RW and minA for standing balance                            Communication Communication Communication: No apparent difficulties  Cognition Arousal: Alert Behavior During Therapy: WFL for tasks assessed/performed   PT - Cognitive impairments: No apparent impairments  Following commands: Intact      Cueing Cueing Techniques: Verbal cues  Exercises Total Joint Exercises Ankle Circles/Pumps: AROM, Both, 10 reps, Seated Quad Sets: AROM, Right, 5 reps, Supine Heel Slides: AAROM, Right, 10 reps, Supine Hip ABduction/ADduction: AROM, AAROM, Right, 10 reps, Supine Straight Leg  Raises: AAROM, Right, 5 reps, Supine    General Comments        Pertinent Vitals/Pain Pain Assessment Pain Assessment: Faces Faces Pain Scale: Hurts even more Pain Location: R knee Pain Descriptors / Indicators: Discomfort, Guarding, Grimacing, Operative site guarding Pain Intervention(s): Monitored during session, Limited activity within patient's tolerance    Home Living                          Prior Function            PT Goals (current goals can now be found in the care plan section) Acute Rehab PT Goals Patient Stated Goal: to improve PT Goal Formulation: With patient Time For Goal Achievement: 11/30/23 Progress towards PT goals: Not progressing toward goals - comment (pain and fatigue)    Frequency    7X/week      PT Plan      Co-evaluation              AM-PAC PT 6 Clicks Mobility   Outcome Measure  Help needed turning from your back to your side while in a flat bed without using bedrails?: A Little Help needed moving from lying on your back to sitting on the side of a flat bed without using bedrails?: A Little Help needed moving to and from a bed to a chair (including a wheelchair)?: A Little Help needed standing up from a chair using your arms (e.g., wheelchair or bedside chair)?: A Lot Help needed to walk in hospital room?: Total Help needed climbing 3-5 steps with a railing? : Total 6 Click Score: 13    End of Session   Activity Tolerance: Patient limited by pain Patient left: with call bell/phone within reach;in bed Nurse Communication: Mobility status PT Visit Diagnosis: Unsteadiness on feet (R26.81);Other abnormalities of gait and mobility (R26.89);Muscle weakness (generalized) (M62.81);Difficulty in walking, not elsewhere classified (R26.2);Pain Pain - Right/Left: Right Pain - part of body: Knee     Time: 1447-1511 PT Time Calculation (min) (ACUTE ONLY): 24 min  Charges:    $Therapeutic Exercise: 23-37 mins PT  General Charges $$ ACUTE PT VISIT: 1 Visit                     Adelae Yodice R. PTA Acute Rehabilitation Services Office: [phone]   Therisa CHRISTELLA Boor 11/24/2023, 4:05 PM

## 2023-11-25 DIAGNOSIS — M1711 Unilateral primary osteoarthritis, right knee: Secondary | ICD-10-CM | POA: Diagnosis not present

## 2023-11-25 LAB — CBC
HCT: 34.5 % — ABNORMAL LOW (ref 36.0–46.0)
Hemoglobin: 11.4 g/dL — ABNORMAL LOW (ref 12.0–15.0)
MCH: 29.4 pg (ref 26.0–34.0)
MCHC: 33 g/dL (ref 30.0–36.0)
MCV: 88.9 fL (ref 80.0–100.0)
Platelets: 139 K/uL — ABNORMAL LOW (ref 150–400)
RBC: 3.88 MIL/uL (ref 3.87–5.11)
RDW: 15.2 % (ref 11.5–15.5)
WBC: 8.5 K/uL (ref 4.0–10.5)
nRBC: 0 % (ref 0.0–0.2)

## 2023-11-25 NOTE — Plan of Care (Signed)
   Problem: Education: Goal: Knowledge of General Education information will improve Description: Including pain rating scale, medication(s)/side effects and non-pharmacologic comfort measures Outcome: Progressing   Problem: Activity: Goal: Risk for activity intolerance will decrease Outcome: Progressing   Problem: Nutrition: Goal: Adequate nutrition will be maintained Outcome: Progressing   Problem: Coping: Goal: Level of anxiety will decrease Outcome: Progressing

## 2023-11-25 NOTE — NC FL2 (Signed)
 [place]  MEDICAID FL2 LEVEL OF CARE FORM     IDENTIFICATION  Patient Name: Sharon Michael Birthdate: Jan 14, 1946 Sex: female Admission Date (Current Location): 11/23/2023  [facility] and [place] Number:  Producer, television/film/video and Address:  [facility], 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) 03/03/2023   Hypokalemia 03/03/2023   Acute renal failure superimposed on stage 3b chronic kidney disease (HCC) 03/03/2023   Benign essential HTN 03/03/2023   Gout 03/03/2023   Callus 02/05/2021   Unilateral primary osteoarthritis, right knee 07/16/2018   Chronic pain of left knee 02/08/2017   Unilateral primary osteoarthritis, left knee 02/08/2017   Type 2 diabetes, controlled, with neuropathy (HCC) 03/19/2013    Orientation RESPIRATION BLADDER Height & Weight     Self, Time, Situation, Place  Normal Continent, External catheter Weight: 201 lb (91.2 kg) Height:  5' 5 (165.1 cm)  BEHAVIORAL SYMPTOMS/MOOD NEUROLOGICAL BOWEL NUTRITION STATUS      Continent Diet (See dc summary)  AMBULATORY STATUS COMMUNICATION OF NEEDS Skin   Limited Assist Verbally Surgical wounds (Closed incision on knee)                       Personal Care Assistance Level of Assistance  Bathing, Feeding, Dressing Bathing Assistance: Limited assistance Feeding assistance: Independent Dressing Assistance: Limited assistance     Functional Limitations Info  Sight Sight Info:  Impaired (Glasses)        SPECIAL CARE FACTORS FREQUENCY  PT (By licensed PT), OT (By licensed OT)     PT Frequency: 5x/week OT Frequency: 5x/week            Contractures Contractures Info: Not present    Additional Factors Info  Code Status, Allergies Code Status Info: Full Allergies Info: NKA           Current Medications (11/25/2023):  This is the current hospital active medication list Current Facility-Administered Medications  Medication Dose Route Frequency Provider Last Rate Last Admin   acetaminophen  (TYLENOL ) tablet 325-650 mg  325-650 mg Oral Q6H PRN [name], MD   650 mg at 11/24/23 1230   allopurinol  (ZYLOPRIM ) tablet 100 mg  100 mg Oral Daily [name], MD   100 mg at 11/25/23 0851   alum & mag hydroxide-simeth (MAALOX/MYLANTA) 200-200-20 MG/5ML suspension 30 mL  30 mL Oral Q4H PRN [name], MD       amLODipine  (NORVASC ) tablet 10 mg  10 mg Oral Daily [name], MD   10 mg at 11/25/23 9148   apixaban  (ELIQUIS ) tablet 2.5 mg  2.5 mg Oral Q12H [name], MD   2.5 mg at 11/25/23 9147   diphenhydrAMINE  (BENADRYL ) 12.5 MG/5ML elixir 12.5-25 mg  12.5-25 mg Oral Q4H PRN [name], MD       docusate sodium  (COLACE) capsule 100 mg  100 mg Oral BID [name], MD   100 mg  at 11/25/23 0852   HYDROmorphone  (DILAUDID ) injection 0.5-1 mg  0.5-1 mg Intravenous Q4H PRN Vernetta Lonni GRADE, MD       menthol -cetylpyridinium (CEPACOL) lozenge 3 mg  1 lozenge Oral PRN Vernetta Lonni GRADE, MD       Or   phenol (CHLORASEPTIC) mouth spray 1 spray  1 spray Mouth/Throat PRN Vernetta Lonni GRADE, MD       metFORMIN  (GLUCOPHAGE ) tablet 500 mg  500 mg Oral Q breakfast Vernetta Lonni GRADE, MD   500 mg at 11/25/23 0851   methocarbamol  (ROBAXIN ) tablet 500 mg  500 mg Oral Q6H PRN Vernetta Lonni GRADE, MD   500 mg at 11/25/23 9148   Or   methocarbamol  (ROBAXIN ) injection 500 mg   500 mg Intravenous Q6H PRN Vernetta Lonni GRADE, MD       metoCLOPramide  (REGLAN ) tablet 5-10 mg  5-10 mg Oral Q8H PRN Vernetta Lonni GRADE, MD       Or   metoCLOPramide  (REGLAN ) injection 5-10 mg  5-10 mg Intravenous Q8H PRN Vernetta Lonni GRADE, MD       metoprolol  succinate (TOPROL -XL) 24 hr tablet 100 mg  100 mg Oral Daily Vernetta Lonni GRADE, MD   100 mg at 11/25/23 0851   Netarsudil -Latanoprost  0.02-0.005 % SOLN 1 drop  1 drop Both Eyes QHS Vernetta Lonni GRADE, MD   1 drop at 11/24/23 2147   ondansetron  (ZOFRAN ) tablet 4 mg  4 mg Oral Q6H PRN Vernetta Lonni GRADE, MD       Or   ondansetron  (ZOFRAN ) injection 4 mg  4 mg Intravenous Q6H PRN Vernetta Lonni GRADE, MD       Oral care mouth rinse  15 mL Mouth Rinse PRN Vernetta Lonni GRADE, MD       oxybutynin  (DITROPAN ) tablet 5 mg  5 mg Oral BID Vernetta Lonni GRADE, MD   5 mg at 11/25/23 0851   oxyCODONE  (Oxy IR/ROXICODONE ) immediate release tablet 10-15 mg  10-15 mg Oral Q4H PRN Vernetta Lonni GRADE, MD   10 mg at 11/25/23 0851   oxyCODONE  (Oxy IR/ROXICODONE ) immediate release tablet 5-10 mg  5-10 mg Oral Q4H PRN Vernetta Lonni GRADE, MD   10 mg at 11/24/23 2143   pantoprazole  (PROTONIX ) EC tablet 40 mg  40 mg Oral Daily Vernetta Lonni GRADE, MD   40 mg at 11/25/23 0851   polyethylene glycol (MIRALAX  / GLYCOLAX ) packet 17 g  17 g Oral Daily PRN Vernetta Lonni GRADE, MD         Discharge Medications: Please see discharge summary for a list of discharge medications.  Relevant Imaging Results:  Relevant Lab Results:   Additional Information SSN [id number] [place], [place]

## 2023-11-25 NOTE — TOC Initial Note (Signed)
 Transition of Care [facility]) - Initial/Assessment Note    Patient Details  Name: Sharon Michael MRN: [id number] Date of Birth: 1946-03-11  [facility]) CM/SW Contact:    Inocente GORMAN Kindle, LCSW Phone Number: 11/25/2023, 12:22 PM  Clinical Narrative:                 CSW received consult for possible SNF placement at time of discharge. CSW spoke with patient. Patient reported that patient's fmily is currently unable to care for patient given patient's current physical needs and fall risk and she lives at home alone. Patient expressed understanding of PT recommendation and is agreeable to SNF placement at time of discharge. CSW discussed insurance authorization process and will provide Medicare SNF ratings list. CSW will send out referrals for review and provide bed offers as available.   Skilled Nursing Rehab Facilities-   [email or web address]   Ratings out of 5 stars (5 the highest)  Name Address  Phone # Quality Care Staffing Health Inspection Overall  [facility] [address] [phone] 3 1 4 3   [facility] [address], [place] [phone] 5 2 4 5   [facility] [address], [place] [phone] 2 1 1 1   [facility] [address], [place] [phone] 4 3 4 4   [facility]  [address], [place] [phone] 5 3 5 5   [facility] [address], [facility] [phone] 5 3 2 3   [facility] [address], [place] [phone] 5 1 2 2   [facility] [phone] [address] [place] [phone] 2 3 4 4   [address] ([place]) [address] [place] [phone] 1 3 3 2   [facility] [address] [place], [place] [phone] 3 1 2 1   [facility] [address], [place] [phone] 3 1 1 1   Clotilda Pereyra [address] [place] [phone] 3 3 4 4   [facility] [address], [place]  [phone] 4 1 2 1   [facility] ([facility]) [address], [place] [phone] 1 1 4 2           [facility] [address], [place] [phone] 3 1 5 4   [facility] [address] [place] [phone] 4 1 1 1   [facility]  [address] [place] [phone] 2 3 1 1   [facility] [address] [phone] 2 2 4 4   [facility] [address] [place] [address], [place] [phone] 2 2 3 3           [facility] [address] [place] [phone] 2 1 2 1   [facility] (No U[facility]) [address], [place] [phone] 5 4 4 5   [facility] [address] [facility] [phone] 3 4 4 4   [facility] [address], [place] [phone] 3 1 2 1   [place] [address] [place] [phone] 3 2 2 2   [facility] [address], [place] [phone] 1 3 3 2   [facility] [address], [place] [phone] 2 2 3 3   [facility] [address] [place], [place] [phone] 2 1 4 3   [facility] [address], [facility] [phone] 2 1 1 1   [facility] [address] [place], [place] [phone] 2 1 2 1   [facility] [address] [place] [phone] 3 1 2  1  [facility] [address], [place] [phone] 4 1 2 1   [place] [address], [place]  [phone] 3 4 4 4   [facility] (No Humana) [address] [place], [place] [phone] 3 2 5 5   [facility]) [address], [place] [phone] 2 2 3 3   [facility] [place] [address], [place] [phone] 5 3 5 5   [facility] [address], [place] [phone] 2 1 1 1   [facility] [address] [place], [place] [phone] 3 5 5 5           [facility] [address] [place], [place] [phone] 5 4 5 5   [facility])  [address] [place] [phone] 1 1 2 1   [facility]) [address], [place] [phone]  2 4 4   [facility] [address] [place] [phone] 3 5 5 5   [address] [place], [place] [phone] 4 2 2 2   [facility]) [address] [place] [phone] 1 1 3 1   [facility] [address] [place] [phone] 2 2 2 2      Expected Discharge Plan: Skilled Nursing Facility Barriers to Discharge: Continued Medical Work up, English as a second language teacher, SNF Pending bed offer   Patient Goals and CMS Choice Patient states their goals for this hospitalization and ongoing recovery are:: Rehab CMS Medicare.gov Compare Post Acute Care list provided to:: Patient Choice offered to / list presented to : Patient [place] ownership interest in [facility].provided to:: Patient    Expected Discharge Plan and Services In-house Referral: Clinical Social Work   Post Acute Care Choice: Skilled Nursing Facility Living arrangements for the past 2 months: Single Family Home                                      Prior Living Arrangements/Services Living arrangements for the past 2 months: Single Family Home Lives with:: Self Patient language and need for interpreter reviewed:: Yes Do you feel safe going back to the place where you live?: Yes      Need for Family Participation in Patient Care: No (Comment) Care giver support system in place?: Yes (comment)   Criminal Activity/Legal Involvement Pertinent to Current Situation/Hospitalization: No - Comment as needed  Activities of Daily Living   ADL Screening (condition at time of admission) Independently performs ADLs?: Yes (appropriate for developmental age) Is the patient deaf or have difficulty hearing?: No Does the patient have difficulty seeing, even when wearing glasses/contacts?: No Does the patient have difficulty concentrating, remembering, or making decisions?: No  Permission  Sought/Granted Permission sought to share information with : Facility Industrial/product designer granted to share information with : Yes, Verbal Permission Granted     Permission granted to share info w AGENCY: SNFs        Emotional Assessment Appearance:: Appears stated age Attitude/Demeanor/Rapport: Engaged Affect (typically observed): Accepting, Appropriate Orientation: : Oriented to Self, Oriented to Place, Oriented to  Time, Oriented to Situation Alcohol / Substance Use: Not Applicable Psych Involvement: No (comment)  Admission diagnosis:  Primary osteoarthritis of right knee [M17.11] Status post total right knee replacement [Z96.651] Patient Active Problem List   Diagnosis Date Noted   Status post total right knee replacement 11/23/2023   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) 03/03/2023   Hypokalemia  03/03/2023   Acute renal failure superimposed on stage 3b chronic kidney disease (HCC) 03/03/2023   Benign essential HTN 03/03/2023   Gout 03/03/2023   Callus 02/05/2021   Unilateral primary osteoarthritis, right knee 07/16/2018   Chronic pain of left knee 02/08/2017   Unilateral primary osteoarthritis, left knee 02/08/2017   Type 2 diabetes, controlled, with neuropathy (HCC) 03/19/2013   PCP:  Shelda Atlas, MD Pharmacy:   [id number] [place], [place] - [address] AT [facility] [address] [place] [address] Phone: [phone] Fax: [phone]  [facility] - [place], [place] - [address] [address] Phone: [phone] Fax: [phone]  [facility] [address] [place] [address] Phone: [phone] Fax: [phone]     Social Drivers of Health (SDOH) Social History: SDOH Screenings   Food Insecurity: No Food Insecurity (11/23/2023)  Housing: Low Risk  (11/23/2023)  Transportation  Needs: No Transportation Needs (11/23/2023)  Utilities: Not At Risk (11/23/2023)  Social Connections: Unknown (11/23/2023)  Tobacco Use: Low Risk  (11/23/2023)   SDOH Interventions:     Readmission Risk Interventions     No data to display

## 2023-11-25 NOTE — Progress Notes (Signed)
 Physical Therapy Treatment Patient Details Name: Sharon Michael MRN: [id number] DOB: 1946-05-07 Today's Date: 11/25/2023   History of Present Illness Pt is a 78 y.o. female who presented 11/23/23 for elective R TKA. PMH includes DM, HTN, HLD, DVT, gout, glaucoma    PT Comments  Pt up in chair on arrival, pleasant and agreeable to session with good progress towards acute goals. Session focused on transfer and stair training as pt continues to have most difficulty with sit<>stand. Pt performing multiple serial sit<>stands from mat table in therapy gym from elevated height for max reinforcement on hand placement on rise and with sitting. Mat table lowered and pt with fair carryover needing light cues and min A to complete x5 serial sit<>stands for continued task practice. Pt able to ascend/descend 2 steps in therapy gym with min A to balance and max cues for sequencing as pt with poor carryover from demonstration and teach back. Pt up in chair with all needs met at end of session. Pt continues to benefit from skilled PT services to progress toward functional mobility goals.      If plan is discharge home, recommend the following: A little help with walking and/or transfers;A little help with bathing/dressing/bathroom;Assistance with cooking/housework;Assist for transportation;Help with stairs or ramp for entrance   Can travel by private vehicle        Equipment Recommendations  Rolling walker (2 wheels);Other (comment) (also needs the rubber end on her SPC replaced)    Recommendations for Other Services       Precautions / Restrictions Precautions Precautions: Fall Required Braces or Orthoses: Knee Immobilizer - Right Knee Immobilizer - Right: Discontinue once straight leg raise with < 10 degree lag (assumed, no formal orders) Restrictions Weight Bearing Restrictions Per Provider Order: Yes RLE Weight Bearing Per Provider Order: Weight bearing as tolerated     Mobility  Bed  Mobility Overal bed mobility: Needs Assistance             General bed mobility comments: pt up in chair on arrival and returned to chair at end of session    Transfers Overall transfer level: Needs assistance Equipment used: Rolling walker (2 wheels) Transfers: Sit to/from Stand, Bed to chair/wheelchair/BSC Sit to Stand: Mod assist, Min assist, Contact guard assist   Step pivot transfers: Mod assist       General transfer comment: pt stood from recliner and from mat in therapy gym, working from elevated mat with serial sit<>sands for reinforcement of hand pllacement, pt able to rise x10 from high mat with CGA, x5 from lower mat with good hand placement and good recall with min A to steady on rise.    Ambulation/Gait Ambulation/Gait assistance: Min assist Gait Distance (Feet): 15 Feet Assistive device: Rolling walker (2 wheels) Gait Pattern/deviations: Step-to pattern, Decreased stance time - right, Decreased weight shift to right, Decreased stride length, Trunk flexed, Antalgic Gait velocity: reduced     General Gait Details: distance limited for focus on transfers and stairs   Stairs Stairs: Yes Stairs assistance: Min assist Stair Management: Two rails, Step to pattern, Forwards Number of Stairs: 2 General stair comments: pt ascending with L and desceding with R, max cues throughotu for sequecing despite demonstration and reach back   Wheelchair Mobility     Tilt Bed    Modified Rankin (Stroke Patients Only)       Balance Overall balance assessment: Needs assistance Sitting-balance support: No upper extremity supported, Feet supported Sitting balance-Leahy Scale: Fair Sitting balance - Comments:  static sitting EOB with supervision for safety   Standing balance support: Bilateral upper extremity supported, During functional activity, Reliant on assistive device for balance Standing balance-Leahy Scale: Poor Standing balance comment: reliant on RW and minA  for standing balance                            Communication Communication Communication: No apparent difficulties  Cognition Arousal: Alert Behavior During Therapy: WFL for tasks assessed/performed   PT - Cognitive impairments: No apparent impairments                         Following commands: Intact      Cueing Cueing Techniques: Verbal cues  Exercises Total Joint Exercises Ankle Circles/Pumps: AROM, Both, 10 reps, Seated Quad Sets: AROM, Right, 5 reps, Seated Heel Slides: AAROM, Right, 5 reps, Seated Straight Leg Raises: PROM, Right, 5 reps    General Comments        Pertinent Vitals/Pain Pain Assessment Pain Assessment: Faces Faces Pain Scale: Hurts little more Pain Location: R knee Pain Descriptors / Indicators: Discomfort, Guarding, Grimacing, Operative site guarding Pain Intervention(s): Monitored during session, Limited activity within patient's tolerance    Home Living                          Prior Function            PT Goals (current goals can now be found in the care plan section) Acute Rehab PT Goals Patient Stated Goal: to improve PT Goal Formulation: With patient Time For Goal Achievement: 11/30/23 Progress towards PT goals: Progressing toward goals    Frequency    7X/week      PT Plan      Co-evaluation              AM-PAC PT 6 Clicks Mobility   Outcome Measure  Help needed turning from your back to your side while in a flat bed without using bedrails?: A Little Help needed moving from lying on your back to sitting on the side of a flat bed without using bedrails?: A Little Help needed moving to and from a bed to a chair (including a wheelchair)?: A Little Help needed standing up from a chair using your arms (e.g., wheelchair or bedside chair)?: A Lot Help needed to walk in hospital room?: A Lot (<40 ft) Help needed climbing 3-5 steps with a railing? : A Lot 6 Click Score: 15    End of  Session Equipment Utilized During Treatment: Gait belt;Right knee immobilizer Activity Tolerance: Patient tolerated treatment well;Patient limited by pain Patient left: in chair;with call bell/phone within reach;with chair alarm set Nurse Communication: Mobility status PT Visit Diagnosis: Unsteadiness on feet (R26.81);Other abnormalities of gait and mobility (R26.89);Muscle weakness (generalized) (M62.81);Difficulty in walking, not elsewhere classified (R26.2);Pain Pain - Right/Left: Right Pain - part of body: Knee     Time: 8553-8481 PT Time Calculation (min) (ACUTE ONLY): 32 min  Charges:    $Gait Training: 23-37 mins PT General Charges $$ ACUTE PT VISIT: 1 Visit                     Adi Seales R. PTA Acute Rehabilitation Services Office: [phone]   Therisa CHRISTELLA Boor 11/25/2023, 4:36 PM

## 2023-11-25 NOTE — Progress Notes (Signed)
 Subjective: 2 Days Post-Op Procedure(s) (LRB): ARTHROPLASTY, KNEE, TOTAL (Right) Patient reports pain as moderate.  Slow progress with PT. Trying to find family that can help at discharge.   Objective: Vital signs in last 24 hours: Temp:  [98 F (36.7 C)-99.3 F (37.4 C)] 99.3 F (37.4 C) (07/12 0733) Pulse Rate:  [64-85] 85 (07/12 0733) Resp:  [16-18] 16 (07/12 0733) BP: (116-152)/(67-84) 152/67 (07/12 0733) SpO2:  [98 %-100 %] 100 % (07/12 0733)  Intake/Output from previous day: 07/11 0701 - 07/12 0700 In: 720 [P.O.:720] Out: 500 [Urine:500] Intake/Output this shift: No intake/output data recorded.  Recent Labs    11/24/23 0850  HGB 11.3*   Recent Labs    11/24/23 0850  WBC 6.0  RBC 3.76*  HCT 34.0*  PLT 157   Recent Labs    11/24/23 0850  NA 138  K 4.0  CL 106  CO2 23  BUN 27*  CREATININE 1.21*  GLUCOSE 129*  CALCIUM  8.7*   No results for input(s): LABPT, INR in the last 72 hours.  Right lower extremity: Dorsiflexion/Plantar flexion intact Incision: scant drainage Compartment soft   Assessment/Plan: 2 Days Post-Op Procedure(s) (LRB): ARTHROPLASTY, KNEE, TOTAL (Right)  Up with therapy to chair . Encouraged her to work on getting up with PT.  Poor progress with PT and no assistance at home. Patient will need to contact family today and see what support can be provided. Dispo pending progress with PT and family support.   Right knee dressing  changed.        Brit Wernette 11/25/2023, 7:46 AM

## 2023-11-25 NOTE — Progress Notes (Signed)
 Physical Therapy Treatment Patient Details Name: Sharon Michael MRN: [id number] DOB: [birth year] Today's Date: 11/25/2023   History of Present Illness Pt is a 78 y.o. female who presented 11/23/23 for elective R TKA. PMH includes DM, HTN, HLD, DVT, gout, glaucoma    PT Comments  Pt up in chair on arrival and agreeable to session with slow but steady progress towards acute goals. Pt continues to be limited by pain, limiting standing and weight bearing tolerance. Pt able to come to stand from recliner and BSC with mod A to boost and steady on rise, with pt needing increased time to elevate trunk and place bil hands on RW. Pt progressing gait tolerance this session ~35' with RW for support, min A to maintain balance and chair follow for safety as pt fatiguing quickly and limited by pain. Pt performing LE exercises at end of session to progress ROM and strength with pt verbalizing understanding of exercises to complete between therapies. Will plan to see pt for PM session to progress mobility as tolerated. Pt continues to benefit from skilled PT services to progress toward functional mobility goals.     If plan is discharge home, recommend the following: A little help with walking and/or transfers;A little help with bathing/dressing/bathroom;Assistance with cooking/housework;Assist for transportation;Help with stairs or ramp for entrance   Can travel by private vehicle        Equipment Recommendations  Rolling walker (2 wheels);Other (comment) (also needs the rubber end on her SPC replaced)    Recommendations for Other Services       Precautions / Restrictions Precautions Precautions: Fall Required Braces or Orthoses: Knee Immobilizer - Right Knee Immobilizer - Right: Discontinue once straight leg raise with < 10 degree lag (assumed, no formal orders) Restrictions Weight Bearing Restrictions Per Provider Order: Yes RLE Weight Bearing Per Provider Order: Weight bearing as tolerated      Mobility  Bed Mobility Overal bed mobility: Needs Assistance             General bed mobility comments: pt up in chair on arrival and returned to chair at end of session    Transfers Overall transfer level: Needs assistance Equipment used: Rolling walker (2 wheels) Transfers: Sit to/from Stand, Bed to chair/wheelchair/BSC Sit to Stand: Mod assist, Min assist   Step pivot transfers: Mod assist       General transfer comment: Pt stood from recliner chair with modA and increased time pushing up from armrest with bil UE. increased time to bring trunk upright and bring hands to RW. mod A to manage RW and max cues for LE sequencing for step pivot to [facility]    Ambulation/Gait Ambulation/Gait assistance: Min assist Gait Distance (Feet): 35 Feet Assistive device: Rolling walker (2 wheels) Gait Pattern/deviations: Step-to pattern, Decreased stance time - right, Decreased weight shift to right, Decreased stride length, Trunk flexed, Antalgic Gait velocity: reduced     General Gait Details: Pt ambulated with R KI donned. She needed cues to sequence pushing through her arms on the RW to [facility] her R leg during stance phase to then advance her L leg as well as max cues to maintain RW in contact with floor and for upright posture. chair follow for safety and min A to maintain balance   Stairs             Wheelchair Mobility     Tilt Bed    Modified Rankin (Stroke Patients Only)       Balance Overall balance assessment:  Needs assistance Sitting-balance support: No upper extremity supported, Feet supported Sitting balance-Leahy Scale: Fair Sitting balance - Comments: static sitting EOB with supervision for safety   Standing balance support: Bilateral upper extremity supported, During functional activity, Reliant on assistive device for balance Standing balance-Leahy Scale: Poor Standing balance comment: reliant on RW and minA for standing balance                             Communication Communication Communication: No apparent difficulties  Cognition Arousal: Alert Behavior During Therapy: WFL for tasks assessed/performed   PT - Cognitive impairments: No apparent impairments                         Following commands: Intact      Cueing Cueing Techniques: Verbal cues  Exercises Total Joint Exercises Ankle Circles/Pumps: AROM, Both, 10 reps, Seated Quad Sets: AROM, Right, 5 reps, Seated Heel Slides: AAROM, Right, 5 reps, Seated Straight Leg Raises: PROM, Right, 5 reps    General Comments General comments (skin integrity, edema, etc.): ice man applied at end of session      Pertinent Vitals/Pain Pain Assessment Pain Assessment: Faces Faces Pain Scale: Hurts little more Pain Location: R knee Pain Descriptors / Indicators: Discomfort, Guarding, Grimacing, Operative site guarding Pain Intervention(s): Monitored during session, Limited activity within patient's tolerance, Ice applied    Home Living                          Prior Function            PT Goals (current goals can now be found in the care plan section) Acute Rehab PT Goals Patient Stated Goal: to improve PT Goal Formulation: With patient Time For Goal Achievement: 11/30/23 Progress towards PT goals: Progressing toward goals    Frequency    7X/week      PT Plan      Co-evaluation              AM-PAC PT 6 Clicks Mobility   Outcome Measure  Help needed turning from your back to your side while in a flat bed without using bedrails?: A Little Help needed moving from lying on your back to sitting on the side of a flat bed without using bedrails?: A Little Help needed moving to and from a bed to a chair (including a wheelchair)?: A Little Help needed standing up from a chair using your arms (e.g., wheelchair or bedside chair)?: A Lot Help needed to walk in hospital room?: A Lot (<40 ft) Help needed climbing 3-5 steps with a  railing? : Total 6 Click Score: 14    End of Session Equipment Utilized During Treatment: Gait belt;Right knee immobilizer Activity Tolerance: Patient tolerated treatment well;Patient limited by pain Patient left: in chair;with call bell/phone within reach;with chair alarm set Nurse Communication: Mobility status PT Visit Diagnosis: Unsteadiness on feet (R26.81);Other abnormalities of gait and mobility (R26.89);Muscle weakness (generalized) (M62.81);Difficulty in walking, not elsewhere classified (R26.2);Pain Pain - Right/Left: Right Pain - part of body: Knee     Time: 1002-1033 PT Time Calculation (min) (ACUTE ONLY): 31 min  Charges:    $Gait Training: 8-22 mins $Therapeutic Exercise: 8-22 mins PT General Charges $$ ACUTE PT VISIT: 1 Visit                     Ami Mally R. PTA Acute  Rehabilitation Services Office: [phone]   Therisa CHRISTELLA Boor 11/25/2023, 10:43 AM

## 2023-11-25 NOTE — Plan of Care (Signed)
  Problem: Pain Management: Goal: Pain level will decrease with appropriate interventions Outcome: Progressing   Problem: Safety: Goal: Ability to remain free from injury will improve Outcome: Progressing

## 2023-11-26 DIAGNOSIS — M1711 Unilateral primary osteoarthritis, right knee: Secondary | ICD-10-CM | POA: Diagnosis not present

## 2023-11-26 MED ORDER — ASPIRIN 81 MG PO CHEW: 81.0000 mg | CHEWABLE_TABLET | Freq: Two times a day (BID) | ORAL | 0 refills | Status: DC

## 2023-11-26 MED ORDER — APIXABAN 5 MG PO TABS: 5.0000 mg | ORAL_TABLET | Freq: Two times a day (BID) | ORAL | 0 refills | Status: AC

## 2023-11-26 MED ORDER — METHOCARBAMOL 500 MG PO TABS: 500.0000 mg | ORAL_TABLET | Freq: Four times a day (QID) | ORAL | 0 refills | Status: AC | PRN

## 2023-11-26 MED ORDER — OXYCODONE HCL 5 MG PO TABS: 5.0000 mg | ORAL_TABLET | Freq: Four times a day (QID) | ORAL | 0 refills | Status: AC | PRN

## 2023-11-26 NOTE — Plan of Care (Signed)
  Problem: Clinical Measurements: Goal: Ability to maintain clinical measurements within normal limits will improve Outcome: Progressing Goal: Diagnostic test results will improve Outcome: Progressing Goal: Respiratory complications will improve Outcome: Progressing Goal: Cardiovascular complication will be avoided Outcome: Progressing   Problem: Activity: Goal: Risk for activity intolerance will decrease Outcome: Progressing

## 2023-11-26 NOTE — Progress Notes (Signed)
 Patient ID: Sharon Michael, female   DOB: 06-Oct-1945, 78 y.o.   MRN: [id number] The patient's right operative knee is stable.  However her mobility is significantly limited and it was not realized that she does not have any family support and now she states that her sister cannot take care of her for another week or more.  Her son is in dialysis today.  It really sounds like the patient needs short-term skilled nursing placement.  An FL 2 has been signed.  Hopefully in the next 1 to 2 days a bed can be found because the patient is medically ready for discharge.

## 2023-11-26 NOTE — Progress Notes (Signed)
 Physical Therapy Treatment Patient Details Name: Sharon Michael MRN: [id number] DOB: Aug 09, 1945 Today's Date: 11/26/2023   History of Present Illness Pt is a 78 y.o. female who presented 11/23/23 for elective R TKA. PMH includes DM, HTN, HLD, DVT, gout, glaucoma    PT Comments  Continuing work on functional mobility and activity tolerance; Session focused on knee stabiltiy without use of knee immobilizer, and pt was able to walk into the bathroom with min assist and without KI; Still needs heavy mod assist and incr time for sit to stand; pt does not have a lot of assist at home and she agrees to going to post-acute rehab   If plan is discharge home, recommend the following: A little help with walking and/or transfers;A little help with bathing/dressing/bathroom;Assistance with cooking/housework;Assist for transportation;Help with stairs or ramp for entrance   Can travel by private vehicle        Equipment Recommendations  Rolling walker (2 wheels);Other (comment) (also needs the rubber end on her SPC replaced)    Recommendations for Other Services       Precautions / Restrictions Precautions Precautions: Fall Required Braces or Orthoses: Knee Immobilizer - Right Knee Immobilizer - Right: Discontinue once straight leg raise with < 10 degree lag (assumed, no formal orders) Restrictions RLE Weight Bearing Per Provider Order: Weight bearing as tolerated     Mobility  Bed Mobility Overal bed mobility: Needs Assistance Bed Mobility: Supine to Sit     Supine to sit: Mod assist     General bed mobility comments: light mod handheld assist to pull to sit    Transfers Overall transfer level: Needs assistance Equipment used: Rolling walker (2 wheels) Transfers: Sit to/from Stand Sit to Stand: Mod assist           General transfer comment: Stood from EOB, and from recliner; initial attempts at standing unsuccessful due to heavy posterior lean; Heavy mod assist and  facilitation of forward weight sihifting; very slow rise    Ambulation/Gait Ambulation/Gait assistance: Min assist Gait Distance (Feet): 20 Feet Assistive device: Rolling walker (2 wheels) Gait Pattern/deviations: Step-to pattern, Decreased stance time - right, Decreased weight shift to right, Decreased stride length, Trunk flexed, Antalgic       General Gait Details: pulled recliner behind for safety as we walked without the KI; short steps, but no overt buckling; distance shortened by need to get to the bathroom   Stairs             Wheelchair Mobility     Tilt Bed    Modified Rankin (Stroke Patients Only)       Balance     Sitting balance-Leahy Scale: Fair       Standing balance-Leahy Scale: Poor Standing balance comment: reliant on RW and minA for standing balance                            Communication Communication Communication: No apparent difficulties  Cognition Arousal: Alert Behavior During Therapy: WFL for tasks assessed/performed   PT - Cognitive impairments: No apparent impairments                         Following commands: Intact      Cueing Cueing Techniques: Verbal cues  Exercises      General Comments        Pertinent Vitals/Pain Pain Assessment Pain Assessment: Faces Faces Pain Scale: Hurts little more Pain  Location: R knee Pain Descriptors / Indicators: Discomfort, Guarding, Grimacing, Operative site guarding Pain Intervention(s): Monitored during session    Home Living                          Prior Function            PT Goals (current goals can now be found in the care plan section) Acute Rehab PT Goals Patient Stated Goal: to improve; agrees to go to SNF for rehab PT Goal Formulation: With patient Time For Goal Achievement: 11/30/23 Potential to Achieve Goals: Good Progress towards PT goals: Progressing toward goals    Frequency    7X/week      PT Plan       Co-evaluation              AM-PAC PT 6 Clicks Mobility   Outcome Measure  Help needed turning from your back to your side while in a flat bed without using bedrails?: A Little Help needed moving from lying on your back to sitting on the side of a flat bed without using bedrails?: A Little Help needed moving to and from a bed to a chair (including a wheelchair)?: A Lot Help needed standing up from a chair using your arms (e.g., wheelchair or bedside chair)?: A Lot Help needed to walk in hospital room?: A Lot Help needed climbing 3-5 steps with a railing? : A Lot 6 Click Score: 14    End of Session Equipment Utilized During Treatment: Gait belt Activity Tolerance: Patient tolerated treatment well;Patient limited by pain Patient left: with call bell/phone within reach;Other (comment);with nursing/sitter in room (On toilet in bathroom; with NT, about to wash up) Nurse Communication: Mobility status PT Visit Diagnosis: Unsteadiness on feet (R26.81);Other abnormalities of gait and mobility (R26.89);Muscle weakness (generalized) (M62.81);Difficulty in walking, not elsewhere classified (R26.2);Pain Pain - Right/Left: Right Pain - part of body: Knee     Time: 8959-8884 PT Time Calculation (min) (ACUTE ONLY): 35 min  Charges:    $Gait Training: 8-22 mins $Therapeutic Activity: 8-22 mins PT General Charges $$ ACUTE PT VISIT: 1 Visit                     Silvano Currier, PT  Acute Rehabilitation Services Office [phone] Secure Chat welcomed    Silvano VEAR Currier 11/26/2023, 2:48 PM

## 2023-11-27 DIAGNOSIS — Z96651 Presence of right artificial knee joint: Secondary | ICD-10-CM

## 2023-11-27 DIAGNOSIS — M1711 Unilateral primary osteoarthritis, right knee: Secondary | ICD-10-CM | POA: Diagnosis not present

## 2023-11-27 NOTE — Progress Notes (Signed)
 Per RN no note about chosen facility

## 2023-11-27 NOTE — Plan of Care (Signed)
  Problem: Education: Goal: Knowledge of General Education information will improve Description: Including pain rating scale, medication(s)/side effects and non-pharmacologic comfort measures Outcome: Progressing   Problem: Clinical Measurements: Goal: Ability to maintain clinical measurements within normal limits will improve Outcome: Progressing Goal: Will remain free from infection Outcome: Progressing Goal: Diagnostic test results will improve Outcome: Progressing Goal: Respiratory complications will improve Outcome: Progressing Goal: Cardiovascular complication will be avoided Outcome: Progressing   Problem: Activity: Goal: Risk for activity intolerance will decrease Outcome: Progressing   Problem: Nutrition: Goal: Adequate nutrition will be maintained Outcome: Progressing   Problem: Coping: Goal: Level of anxiety will decrease Outcome: Progressing   Problem: Elimination: Goal: Will not experience complications related to bowel motility Outcome: Progressing Goal: Will not experience complications related to urinary retention Outcome: Progressing   Problem: Skin Integrity: Goal: Risk for impaired skin integrity will decrease Outcome: Progressing   Problem: Education: Goal: Knowledge of the prescribed therapeutic regimen will improve Outcome: Progressing Goal: Individualized Educational Video(s) Outcome: Progressing   Problem: Clinical Measurements: Goal: Postoperative complications will be avoided or minimized Outcome: Progressing   Problem: Pain Management: Goal: Pain level will decrease with appropriate interventions Outcome: Progressing   Problem: Skin Integrity: Goal: Will show signs of wound healing Outcome: Progressing

## 2023-11-27 NOTE — Discharge Summary (Signed)
 Patient ID: Sharon Michael MRN: [id number] DOB/AGE: [birth year] 79 y.o.  Admit date: 11/23/2023 Discharge date: 11/27/2023  Admission Diagnoses:  Principal Problem:   Unilateral primary osteoarthritis, right knee Active Problems:   Status post total right knee replacement   Discharge Diagnoses:  Same  Past Medical History:  Diagnosis Date   Ambulates with cane    straight   Diabetes mellitus without complication (HCC)    type 2, patient does not check blood sugar   DVT (deep venous thrombosis) (HCC)    chronic DVT of RLE   Glaucoma    Gout    Hyperlipidemia    Hypertension     Surgeries: Procedure(s): ARTHROPLASTY, KNEE, TOTAL on 11/23/2023   Consultants:   Discharged Condition: Improved  Hospital Course: Sharon Michael is an 78 y.o. female who was admitted 11/23/2023 for operative treatment ofUnilateral primary osteoarthritis, right knee. Patient has severe unremitting pain that affects sleep, daily activities, and work/hobbies. After pre-op clearance the patient was taken to the operating room on 11/23/2023 and underwent  Procedure(s): ARTHROPLASTY, KNEE, TOTAL.    Patient was given perioperative antibiotics:  Anti-infectives (From admission, onward)    Start     Dose/Rate Route Frequency Ordered Stop   11/23/23 1345  ceFAZolin  (ANCEF ) IVPB 2g/100 mL premix        2 g 200 mL/hr over 30 Minutes Intravenous Every 6 hours 11/23/23 1219 11/24/23 0144   11/23/23 0630  ceFAZolin  (ANCEF ) IVPB 2g/100 mL premix        2 g 200 mL/hr over 30 Minutes Intravenous On call to O.R. 11/23/23 9378 11/23/23 9175        Patient was given sequential compression devices, early ambulation, and chemoprophylaxis to prevent DVT.  Patient benefited maximally from hospital stay and there were no complications.    Recent vital signs: Patient Vitals for the past 24 hrs:  BP Temp Temp src Pulse Resp SpO2  11/27/23 0300 106/67 98.9 F (37.2 C) Oral 75 16 --  11/26/23 1950 (!)  140/77 (!) 97.3 F (36.3 C) Oral 77 17 --  11/26/23 1500 110/64 98.8 F (37.1 C) Oral 75 17 100 %     Recent laboratory studies:  Recent Labs    11/24/23 0850 11/25/23 0644  WBC 6.0 8.5  HGB 11.3* 11.4*  HCT 34.0* 34.5*  PLT 157 139*  NA 138  --   K 4.0  --   CL 106  --   CO2 23  --   BUN 27*  --   CREATININE 1.21*  --   GLUCOSE 129*  --   CALCIUM  8.7*  --      Discharge Medications:   Allergies as of 11/27/2023   No Known Allergies      Medication List     STOP taking these medications    aspirin  EC 81 MG tablet       TAKE these medications    allopurinol  100 MG tablet Commonly known as: ZYLOPRIM  Take 100 mg by mouth daily.   amLODipine  10 MG tablet Commonly known as: NORVASC  Take 10 mg by mouth daily.   apixaban  5 MG Tabs tablet Commonly known as: ELIQUIS  Take 1 tablet (5 mg total) by mouth 2 (two) times daily. What changed: See the new instructions.   atorvastatin  40 MG tablet Commonly known as: LIPITOR Take 40 mg by mouth daily.   Colcrys  0.6 MG tablet Generic drug: colchicine  Take 1 tablet (0.6 mg total) by mouth 2 (two) times daily. Back  down to once daily once acute pain in lower extremities   metFORMIN  500 MG tablet Commonly known as: GLUCOPHAGE  Take 500 mg by mouth daily.   methocarbamol  500 MG tablet Commonly known as: ROBAXIN  Take 1 tablet (500 mg total) by mouth every 6 (six) hours as needed for muscle spasms.   metoprolol  succinate 100 MG 24 hr tablet Commonly known as: TOPROL -XL Take 100 mg by mouth daily.   oxybutynin  5 MG tablet Commonly known as: DITROPAN  Take 5 mg by mouth 2 (two) times daily.   oxyCODONE  5 MG immediate release tablet Commonly known as: Oxy IR/ROXICODONE  Take 1-2 tablets (5-10 mg total) by mouth every 6 (six) hours as needed for moderate pain (pain score 4-6) (pain score 4-6).   Rocklatan  0.02-0.005 % Soln Generic drug: Netarsudil -Latanoprost  Place 1 drop into both eyes at bedtime.    telmisartan-hydrochlorothiazide 80-12.5 MG tablet Commonly known as: MICARDIS HCT Take 1 tablet by mouth daily.               Durable Medical Equipment  (From admission, onward)           Start     Ordered   11/23/23 1207  DME 3 n 1  Once        11/23/23 1206   11/23/23 1207  DME Walker rolling  Once       Question Answer Comment  Walker: With 5 Inch Wheels   Patient needs a walker to treat with the following condition Status post total right knee replacement      11/23/23 1206            Diagnostic Studies: DG Knee Right Port Result Date: 11/23/2023 CLINICAL DATA:  Status post right total knee replacement. EXAM: PORTABLE RIGHT KNEE - 1-2 VIEW COMPARISON:  05/19/2021. FINDINGS: Status post right total knee arthroplasty with normal alignment. No acute fracture or dislocation. Expected postoperative soft tissue swelling, soft tissue/intra-articular air, and cutaneous staples anteriorly. IMPRESSION: Status post right total knee arthroplasty.  No acute complication. Electronically Signed   By: Harrietta Sherry M.D.   On: 11/23/2023 13:28    Disposition: Discharge disposition: 03-Skilled Nursing Facility          Follow-up Information     Vernetta Lonni GRADE, MD Follow up in 2 week(s).   Specialty: Orthopedic Surgery Contact information: [address] [place] [address] [phone]                  Signed: Lonni GRADE Vernetta 11/27/2023, 7:49 AM

## 2023-11-27 NOTE — Plan of Care (Addendum)
 Attempt to call facility x2 no answer   Problem: Education: Goal: Knowledge of General Education information will improve Description: Including pain rating scale, medication(s)/side effects and non-pharmacologic comfort measures Outcome: Progressing   Problem: Activity: Goal: Risk for activity intolerance will decrease Outcome: Progressing   Problem: Pain Managment: Goal: General experience of comfort will improve and/or be controlled Outcome: Progressing   Problem: Safety: Goal: Ability to remain free from injury will improve Outcome: Progressing   Problem: Skin Integrity: Goal: Risk for impaired skin integrity will decrease Outcome: Progressing

## 2023-11-27 NOTE — Care Management Obs Status (Signed)
 MEDICARE OBSERVATION STATUS NOTIFICATION   Patient Details  Name: Sharon Michael MRN: [id number] Date of Birth: 1946/03/18   Medicare Observation Status Notification Given:  Yes    Rosalva Jon Bloch, RN 11/27/2023, 10:25 AM

## 2023-11-27 NOTE — Care Management CC44 (Signed)
 Condition Code 44 Documentation Completed  Patient Details  Name: Marcelle Hepner MRN: [id number] Date of Birth: January 16, 1946   Condition Code 44 given:  Yes Patient signature on Condition Code 44 notice:  Yes Documentation of 2 MD's agreement:  Yes Code 44 added to claim:  Yes    Rosalva Jon Bloch, RN 11/27/2023, 10:25 AM

## 2023-11-27 NOTE — Progress Notes (Signed)
 Mobility Specialist Progress Note:    11/27/23 1056  Mobility  Activity Ambulated with assistance to bathroom  Level of Assistance Moderate assist, patient does 50-74% (+2)  Assistive Device Front wheel walker  Distance Ambulated (ft) 20 ft  RLE Weight Bearing Per Provider Order WBAT  Activity Response Tolerated well  Mobility Referral Yes  Mobility visit 1 Mobility  Mobility Specialist Start Time (ACUTE ONLY) 1040  Mobility Specialist Stop Time (ACUTE ONLY) 1056  Mobility Specialist Time Calculation (min) (ACUTE ONLY) 16 min   Received pt in chair and needing assistance to the BR. Required heavy mod assist from the chair, contact guard to ambulate to BR. To stand from toilet, pt required mod assist +2. Returned pt to chair with alarm on. Personal belongings and call light within reach. All needs met.   Lavanda Pollack Mobility Specialist  Please contact via [employer] or  Rehab Office [phone]

## 2023-11-27 NOTE — TOC Transition Note (Signed)
 Transition of Care [facility]) - Discharge Note   Patient Details  Name: Sharon Michael MRN: [id number] Date of Birth: 1946-02-23  Transition of Care [facility]) CM/SW Contact:  Bridget Cordella Simmonds, LCSW Phone Number: 11/27/2023, 11:45 AM   Clinical Narrative:   Pt discharging to [facility]. RN call report to [phone].    Final next level of care: Skilled Nursing Facility Barriers to Discharge: Barriers Resolved   Patient Goals and CMS Choice Patient states their goals for this hospitalization and ongoing recovery are:: Rehab CMS Medicare.gov Compare Post Acute Care list provided to:: Patient Choice offered to / list presented to : Patient [place] ownership interest in [facility].provided to:: Patient    Discharge Placement              Patient chooses bed at: [facility] Patient to be transferred to facility by: [facility] Name of family member notified: son Asher Patient and family notified of of transfer: 11/27/23  Discharge Plan and Services Additional resources added to the After Visit Summary for   In-house Referral: Clinical Social Work   Post Acute Care Choice: Skilled Nursing Facility                               Social Drivers of Health (SDOH) Interventions SDOH Screenings   Food Insecurity: No Food Insecurity (11/23/2023)  Housing: Low Risk  (11/23/2023)  Transportation Needs: No Transportation Needs (11/23/2023)  Utilities: Not At Risk (11/23/2023)  Social Connections: Unknown (11/23/2023)  Tobacco Use: Low Risk  (11/23/2023)     Readmission Risk Interventions     No data to display

## 2023-11-27 NOTE — TOC Progression Note (Signed)
 Transition of Care [facility]) - Progression Note    Patient Details  Name: Melanye Hiraldo MRN: [id number] Date of Birth: 1946-05-10  Transition of Care [facility]) CM/SW Contact  Bridget Cordella Simmonds, LCSW Phone Number: 11/27/2023, 10:59 AM  Clinical Narrative:   Bed offers provided to pt and son Garen on medicare choice document.  They are requesting response from [facility], CSW reached out to that facility.  1030: [facility] does offer bed, son informed and does want to accepts.  CSW confirmed with Brittany/[facility] that they can receive pt today.  Auth request submitted in [place] and approved: [id number], 3 days: 7/14-7/16.     Expected Discharge Plan: Skilled Nursing Facility Barriers to Discharge: Continued Medical Work up, English as a second language teacher, SNF Pending bed offer  Expected Discharge Plan and Services In-house Referral: Clinical Social Work   Post Acute Care Choice: Skilled Nursing Facility Living arrangements for the past 2 months: Single Family Home Expected Discharge Date: 11/27/23                                     Social Determinants of Health (SDOH) Interventions SDOH Screenings   Food Insecurity: No Food Insecurity (11/23/2023)  Housing: Low Risk  (11/23/2023)  Transportation Needs: No Transportation Needs (11/23/2023)  Utilities: Not At Risk (11/23/2023)  Social Connections: Unknown (11/23/2023)  Tobacco Use: Low Risk  (11/23/2023)    Readmission Risk Interventions     No data to display

## 2023-11-27 NOTE — Progress Notes (Signed)
    Durable Medical Equipment  (From admission, onward)           Start     Ordered   11/27/23 1013  DME 3 n 1  Once       Comments: Bedside commode. Confine to one room   11/27/23 1013   11/23/23 1207  DME Walker rolling  Once       Question Answer Comment  Walker: With 5 Inch Wheels   Patient needs a walker to treat with the following condition Status post total right knee replacement      11/23/23 1206

## 2023-11-27 NOTE — Progress Notes (Signed)
 Patient ID: Sharon Michael, female   DOB: 08-19-1945, 78 y.o.   MRN: [id number] The patient is awake and alert this morning.  Her right operative knee is stable.  An FL 2 has been signed.  Prescriptions are on the chart.  She can be discharged to short-term skilled nursing as soon as today if a bed is available.  Will put in a discharge summary for today just in case.  She is medically stable and can be discharged.

## 2023-11-27 NOTE — Progress Notes (Signed)
 Physical Therapy Treatment Patient Details Name: Sharon Michael MRN: [id number] DOB: 11-08-1945 Today's Date: 11/27/2023   History of Present Illness Pt is a 78 y.o. female who presented 11/23/23 for elective R TKA. PMH includes DM, HTN, HLD, DVT, gout, glaucoma    PT Comments  Pt resting in bed on arrival, pleasant and agreeable to session with continued progress towards acute goals. Session focused on gait training with pt demonstrating increased activity and standing tolerance, progressing gait distance with RW for support, min A for balance and chair follow for safety. Pt with no overt buckling noted during gait without KI donned. Pt continues to require heavy mod A to come to stand with max cues for hand placement with poor carryover from previous sessions. Pt continues to benefit from skilled PT services to progress toward functional mobility goals.     If plan is discharge home, recommend the following: A little help with walking and/or transfers;A little help with bathing/dressing/bathroom;Assistance with cooking/housework;Assist for transportation;Help with stairs or ramp for entrance   Can travel by private vehicle        Equipment Recommendations  Rolling walker (2 wheels);Other (comment) (also needs the rubber end on her SPC replaced)    Recommendations for Other Services       Precautions / Restrictions Precautions Precautions: Fall Required Braces or Orthoses: Knee Immobilizer - Right Knee Immobilizer - Right: Discontinue once straight leg raise with < 10 degree lag (assumed, no formal orders) Restrictions Weight Bearing Restrictions Per Provider Order: Yes RLE Weight Bearing Per Provider Order: Weight bearing as tolerated     Mobility  Bed Mobility Overal bed mobility: Needs Assistance Bed Mobility: Supine to Sit     Supine to sit: Mod assist     General bed mobility comments: mod A to manage LLE to and off EOB, cues for optimal hand placement to scoot out to  EOB    Transfers Overall transfer level: Needs assistance Equipment used: Rolling walker (2 wheels) Transfers: Sit to/from Stand Sit to Stand: Mod assist           General transfer comment: Stood from EOB with heavy mod A to rise, initial attempt unsucessful due to posterior lean and poor power up, cues for hand placement    Ambulation/Gait Ambulation/Gait assistance: Min assist Gait Distance (Feet): 45 Feet Assistive device: Rolling walker (2 wheels) Gait Pattern/deviations: Step-to pattern, Decreased stance time - right, Decreased weight shift to right, Decreased stride length, Trunk flexed, Antalgic       General Gait Details: pulled recliner behind for safety as we walked without the KI; short steps, but no overt buckling   Stairs             Wheelchair Mobility     Tilt Bed    Modified Rankin (Stroke Patients Only)       Balance Overall balance assessment: Needs assistance Sitting-balance support: No upper extremity supported, Feet supported Sitting balance-Leahy Scale: Fair Sitting balance - Comments: static sitting EOB with supervision for safety   Standing balance support: Bilateral upper extremity supported, During functional activity, Reliant on assistive device for balance Standing balance-Leahy Scale: Poor Standing balance comment: reliant on RW and minA for standing balance                            Communication Communication Communication: No apparent difficulties  Cognition Arousal: Alert Behavior During Therapy: WFL for tasks assessed/performed   PT - Cognitive impairments:  No apparent impairments                         Following commands: Intact      Cueing Cueing Techniques: Verbal cues  Exercises Total Joint Exercises Ankle Circles/Pumps: AROM, Both, 10 reps, Seated Quad Sets: AROM, Right, 10 reps, Seated Heel Slides: AAROM, Right, 10 reps, Seated    General Comments        Pertinent Vitals/Pain  Pain Assessment Pain Assessment: Faces Faces Pain Scale: Hurts little more Pain Location: R knee Pain Descriptors / Indicators: Discomfort, Guarding, Grimacing, Operative site guarding Pain Intervention(s): Monitored during session, Limited activity within patient's tolerance    Home Living                          Prior Function            PT Goals (current goals can now be found in the care plan section) Acute Rehab PT Goals Patient Stated Goal: to improve; agrees to go to SNF for rehab PT Goal Formulation: With patient Time For Goal Achievement: 11/30/23 Progress towards PT goals: Progressing toward goals    Frequency    7X/week      PT Plan      Co-evaluation              AM-PAC PT 6 Clicks Mobility   Outcome Measure  Help needed turning from your back to your side while in a flat bed without using bedrails?: A Little Help needed moving from lying on your back to sitting on the side of a flat bed without using bedrails?: A Little Help needed moving to and from a bed to a chair (including a wheelchair)?: A Lot Help needed standing up from a chair using your arms (e.g., wheelchair or bedside chair)?: A Lot Help needed to walk in hospital room?: A Little Help needed climbing 3-5 steps with a railing? : A Lot 6 Click Score: 15    End of Session Equipment Utilized During Treatment: Gait belt Activity Tolerance: Patient tolerated treatment well;Patient limited by pain Patient left: with call bell/phone within reach;in chair;with chair alarm set;with family/visitor present Nurse Communication: Mobility status PT Visit Diagnosis: Unsteadiness on feet (R26.81);Other abnormalities of gait and mobility (R26.89);Muscle weakness (generalized) (M62.81);Difficulty in walking, not elsewhere classified (R26.2);Pain Pain - Right/Left: Right Pain - part of body: Knee     Time: 9044-8981 PT Time Calculation (min) (ACUTE ONLY): 23 min  Charges:    $Gait  Training: 23-37 mins PT General Charges $$ ACUTE PT VISIT: 1 Visit                     Shauntelle Jamerson R. PTA Acute Rehabilitation Services Office: [phone]   Therisa CHRISTELLA Boor 11/27/2023, 10:31 AM

## 2023-12-06 ENCOUNTER — Ambulatory Visit (INDEPENDENT_AMBULATORY_CARE_PROVIDER_SITE_OTHER): Admitting: Orthopaedic Surgery

## 2023-12-06 ENCOUNTER — Encounter: Payer: Self-pay | Admitting: Orthopaedic Surgery

## 2023-12-06 DIAGNOSIS — Z96651 Presence of right artificial knee joint: Secondary | ICD-10-CM

## 2023-12-06 NOTE — Progress Notes (Signed)
 The patient is a 79 year old is here today for first postoperative visit status post a right total knee replacement.  She does not have a great home situation so she is recovering at skilled nursing facility.  She is still working on her home situation.  She does report pain from the surgery.  She has been working on range of motion of her knee.  She is on chronic Eliquis  as well as oxycodone  and Robaxin .  On exam her right knee shows full extension.  Her calf is soft.  Her flexion is to about 90 degrees.  The incision looks good and staples are in place.  The staples can be removed today and Steri-Strips applied.  She will continue to work on aggressive range of motion of her right knee.  Hopefully her home situation will improve.  From our standpoint we will see her back in a month to see how she is doing overall from a range of motion standpoint but x-rays are not needed.

## 2024-01-03 ENCOUNTER — Telehealth: Payer: Self-pay | Admitting: Orthopaedic Surgery

## 2024-01-03 NOTE — Telephone Encounter (Signed)
 Marsha an Licensed conveyancer called and wants to report that she discharged her yesterday because she is able to do activities on her own. [phone]

## 2024-01-08 ENCOUNTER — Encounter: Payer: Self-pay | Admitting: Orthopaedic Surgery

## 2024-01-08 ENCOUNTER — Ambulatory Visit (INDEPENDENT_AMBULATORY_CARE_PROVIDER_SITE_OTHER): Admitting: Orthopaedic Surgery

## 2024-01-08 DIAGNOSIS — Z96651 Presence of right artificial knee joint: Secondary | ICD-10-CM

## 2024-01-08 NOTE — Progress Notes (Signed)
 The patient is here today at 6 weeks status post a right total knee replacement to treat significant right knee pain and arthritis.  She comes in today using a rolling walker.  She is 78 years old.  She has had somewhat of a difficult home situation and reports that sometime next month she is moving to [place] but will still be able to come back this way.  On my exam today her extension is full and her flexion is to well past 90 degrees fortunately.  The knee feels stable on exam.  From my standpoint we will see her back in 3 months with a standing AP and lateral of her right knee.  If there are issues before then she knows to reach out and let us  know.

## 2024-04-08 ENCOUNTER — Other Ambulatory Visit: Payer: Self-pay

## 2024-04-08 ENCOUNTER — Encounter: Payer: Self-pay | Admitting: Orthopaedic Surgery

## 2024-04-08 ENCOUNTER — Ambulatory Visit: Admitting: Orthopaedic Surgery

## 2024-04-08 DIAGNOSIS — Z96651 Presence of right artificial knee joint: Secondary | ICD-10-CM

## 2024-04-08 NOTE — Progress Notes (Signed)
 The patient is now just over 4 months status post a right total knee arthroplasty to treat severe right knee pain and arthritis.  She has moved down to near the [place] area of [place]  recently.  She says the knee is doing well overall.  On my exam today her extension is full and her flexion is almost full.  The knee feels loosely stable in that right knee.  I did review with her her preoperative and postoperative x-rays showing her the extent of the arthritis and large osteophytes around her right knee that are now gone.  X-rays of the knee replacement showed that it is well-seated with no complicating features.  At this point follow-up for her knee can be as needed here since she [place] in [place] part [place] at this point.  She knows to follow-up with us  if there are any issues at all and we can always send information down to whoever she chooses is orthopedic physician down in that area of the state.
